# Patient Record
Sex: Female | Born: 1945 | Race: White | Hispanic: No | Marital: Married | Smoking: Never smoker
Health system: Southern US, Community
[De-identification: ages and names within clinical notes are randomized; demographics above are authoritative.]

## PROBLEM LIST (undated history)

## (undated) DIAGNOSIS — E876 Hypokalemia: Secondary | ICD-10-CM

## (undated) DIAGNOSIS — I509 Heart failure, unspecified: Secondary | ICD-10-CM

## (undated) DIAGNOSIS — Z7901 Long term (current) use of anticoagulants: Secondary | ICD-10-CM

## (undated) DIAGNOSIS — I639 Cerebral infarction, unspecified: Secondary | ICD-10-CM

## (undated) DIAGNOSIS — I480 Paroxysmal atrial fibrillation: Secondary | ICD-10-CM

## (undated) HISTORY — DX: Hypokalemia: E87.6

## (undated) HISTORY — DX: Cerebral infarction, unspecified: I63.9

## (undated) HISTORY — DX: Heart failure, unspecified: I50.9

## (undated) HISTORY — DX: Paroxysmal atrial fibrillation: I48.0

## (undated) HISTORY — DX: Long term (current) use of anticoagulants: Z79.01

## (undated) HISTORY — PX: TONSILLECTOMY AND ADENOIDECTOMY: SHX28

## (undated) HISTORY — PX: CHOLECYSTECTOMY: SHX55

---

## 1999-11-03 ENCOUNTER — Other Ambulatory Visit: Admission: RE | Admit: 1999-11-03 | Discharge: 1999-11-03 | Payer: Self-pay | Admitting: Obstetrics & Gynecology

## 2000-05-07 ENCOUNTER — Encounter: Payer: Self-pay | Admitting: Emergency Medicine

## 2000-05-07 ENCOUNTER — Inpatient Hospital Stay (HOSPITAL_COMMUNITY): Admission: AD | Admit: 2000-05-07 | Discharge: 2000-05-11 | Payer: Self-pay | Admitting: Cardiology

## 2000-10-13 ENCOUNTER — Encounter: Payer: Self-pay | Admitting: Emergency Medicine

## 2000-10-13 ENCOUNTER — Emergency Department (HOSPITAL_COMMUNITY): Admission: EM | Admit: 2000-10-13 | Discharge: 2000-10-13 | Payer: Self-pay | Admitting: Emergency Medicine

## 2000-11-25 ENCOUNTER — Ambulatory Visit (HOSPITAL_COMMUNITY): Admission: RE | Admit: 2000-11-25 | Discharge: 2000-11-25 | Payer: Self-pay | Admitting: Cardiology

## 2004-03-31 ENCOUNTER — Ambulatory Visit: Payer: Self-pay | Admitting: *Deleted

## 2004-05-05 ENCOUNTER — Ambulatory Visit: Payer: Self-pay

## 2004-06-04 ENCOUNTER — Ambulatory Visit: Payer: Self-pay | Admitting: *Deleted

## 2004-07-01 ENCOUNTER — Ambulatory Visit: Payer: Self-pay | Admitting: Cardiology

## 2004-07-08 ENCOUNTER — Ambulatory Visit: Payer: Self-pay

## 2004-08-04 ENCOUNTER — Ambulatory Visit: Payer: Self-pay | Admitting: Cardiology

## 2004-12-29 ENCOUNTER — Ambulatory Visit: Payer: Self-pay | Admitting: Cardiology

## 2005-04-16 ENCOUNTER — Other Ambulatory Visit: Admission: RE | Admit: 2005-04-16 | Discharge: 2005-04-16 | Payer: Self-pay | Admitting: Family Medicine

## 2005-07-29 ENCOUNTER — Ambulatory Visit: Payer: Self-pay | Admitting: Cardiology

## 2005-12-28 ENCOUNTER — Ambulatory Visit: Payer: Self-pay | Admitting: Cardiology

## 2006-09-30 ENCOUNTER — Ambulatory Visit: Payer: Self-pay | Admitting: Cardiology

## 2006-10-07 ENCOUNTER — Ambulatory Visit: Payer: Self-pay | Admitting: Cardiology

## 2006-10-07 ENCOUNTER — Ambulatory Visit (HOSPITAL_COMMUNITY): Admission: RE | Admit: 2006-10-07 | Discharge: 2006-10-07 | Payer: Self-pay | Admitting: Cardiology

## 2007-09-15 ENCOUNTER — Encounter (INDEPENDENT_AMBULATORY_CARE_PROVIDER_SITE_OTHER): Payer: Self-pay | Admitting: *Deleted

## 2007-09-15 ENCOUNTER — Ambulatory Visit: Payer: Self-pay | Admitting: Cardiology

## 2007-09-15 LAB — CONVERTED CEMR LAB
BUN: 12 mg/dL
Chloride: 104 meq/L
Sodium: 142 meq/L
Total Protein: 7.7 g/dL

## 2007-09-30 ENCOUNTER — Ambulatory Visit: Payer: Self-pay | Admitting: Cardiology

## 2008-10-05 DIAGNOSIS — I509 Heart failure, unspecified: Secondary | ICD-10-CM | POA: Insufficient documentation

## 2008-10-05 DIAGNOSIS — I6350 Cerebral infarction due to unspecified occlusion or stenosis of unspecified cerebral artery: Secondary | ICD-10-CM | POA: Insufficient documentation

## 2008-10-05 DIAGNOSIS — I4891 Unspecified atrial fibrillation: Secondary | ICD-10-CM | POA: Insufficient documentation

## 2008-10-08 ENCOUNTER — Encounter: Payer: Self-pay | Admitting: Cardiology

## 2008-10-08 ENCOUNTER — Ambulatory Visit: Payer: Self-pay | Admitting: Cardiology

## 2009-01-07 ENCOUNTER — Encounter (INDEPENDENT_AMBULATORY_CARE_PROVIDER_SITE_OTHER): Payer: Self-pay | Admitting: *Deleted

## 2009-01-07 LAB — CONVERTED CEMR LAB: OCCULT 1: NEGATIVE

## 2009-04-08 ENCOUNTER — Telehealth: Payer: Self-pay | Admitting: Cardiology

## 2009-04-09 ENCOUNTER — Encounter (INDEPENDENT_AMBULATORY_CARE_PROVIDER_SITE_OTHER): Payer: Self-pay | Admitting: *Deleted

## 2009-06-11 ENCOUNTER — Telehealth (INDEPENDENT_AMBULATORY_CARE_PROVIDER_SITE_OTHER): Payer: Self-pay | Admitting: *Deleted

## 2009-06-20 ENCOUNTER — Emergency Department (HOSPITAL_COMMUNITY): Admission: EM | Admit: 2009-06-20 | Discharge: 2009-06-21 | Payer: Self-pay | Admitting: Emergency Medicine

## 2009-06-21 ENCOUNTER — Ambulatory Visit: Payer: Self-pay | Admitting: Cardiology

## 2009-06-26 ENCOUNTER — Encounter (INDEPENDENT_AMBULATORY_CARE_PROVIDER_SITE_OTHER): Payer: Self-pay | Admitting: *Deleted

## 2009-06-26 ENCOUNTER — Ambulatory Visit: Payer: Self-pay | Admitting: Cardiology

## 2009-06-26 DIAGNOSIS — R42 Dizziness and giddiness: Secondary | ICD-10-CM | POA: Insufficient documentation

## 2009-06-27 ENCOUNTER — Encounter: Payer: Self-pay | Admitting: Cardiology

## 2009-12-16 ENCOUNTER — Encounter (INDEPENDENT_AMBULATORY_CARE_PROVIDER_SITE_OTHER): Payer: Self-pay | Admitting: *Deleted

## 2009-12-16 ENCOUNTER — Ambulatory Visit: Payer: Self-pay | Admitting: Cardiology

## 2009-12-16 DIAGNOSIS — R0989 Other specified symptoms and signs involving the circulatory and respiratory systems: Secondary | ICD-10-CM

## 2009-12-16 LAB — CONVERTED CEMR LAB
Basophils Absolute: 0 10*3/uL
Basophils Relative: 0 %
Eosinophils Absolute: 0.1 10*3/uL (ref 0.0–0.7)
Eosinophils Relative: 2 %
HCT: 42.4 % (ref 36.0–46.0)
Hemoglobin: 14.8 g/dL
Lymphocytes Relative: 25 %
Lymphs Abs: 1.8 10*3/uL
MCHC: 33.3 g/dL
MCHC: 34.9 g/dL (ref 30.0–36.0)
MCV: 95.5 fL
Monocytes Absolute: 0.5 10*3/uL (ref 0.1–1.0)
Monocytes Relative: 7 %
Neutrophils Relative %: 212 %
Platelets: 14.1 10*3/uL
Platelets: 212 10*3/uL (ref 150–400)
RBC: 4.44 M/uL
RDW: 14.1 % (ref 11.5–15.5)
RDW: 34.9 %
WBC: 7.3 10*3/uL

## 2009-12-17 ENCOUNTER — Encounter: Payer: Self-pay | Admitting: Cardiology

## 2009-12-19 ENCOUNTER — Ambulatory Visit: Payer: Self-pay | Admitting: Cardiology

## 2009-12-19 ENCOUNTER — Ambulatory Visit (HOSPITAL_COMMUNITY): Admission: RE | Admit: 2009-12-19 | Discharge: 2009-12-19 | Payer: Self-pay | Admitting: Cardiology

## 2009-12-23 ENCOUNTER — Encounter (INDEPENDENT_AMBULATORY_CARE_PROVIDER_SITE_OTHER): Payer: Self-pay | Admitting: *Deleted

## 2009-12-24 ENCOUNTER — Telehealth (INDEPENDENT_AMBULATORY_CARE_PROVIDER_SITE_OTHER): Payer: Self-pay | Admitting: *Deleted

## 2009-12-25 ENCOUNTER — Encounter (INDEPENDENT_AMBULATORY_CARE_PROVIDER_SITE_OTHER): Payer: Self-pay | Admitting: *Deleted

## 2010-04-25 ENCOUNTER — Encounter (INDEPENDENT_AMBULATORY_CARE_PROVIDER_SITE_OTHER): Payer: Self-pay | Admitting: *Deleted

## 2010-04-25 LAB — CONVERTED CEMR LAB
OCCULT 2: NEGATIVE
OCCULT 3: NEGATIVE

## 2010-04-28 ENCOUNTER — Encounter (INDEPENDENT_AMBULATORY_CARE_PROVIDER_SITE_OTHER): Payer: Self-pay | Admitting: *Deleted

## 2010-06-17 NOTE — Assessment & Plan Note (Signed)
Summary: W1X      Allergies Added: NKDA  Visit Type:  Follow-up Primary Provider:  Dr. Meyers([facility]   History of Present Illness: [name] returns to the office as scheduled for continued assessment and treatment of paroxysmal atrial fibrillation.  Since her last visit, she has done superbly.  She reports no palpitations, no dyspnea no chest discomfort.  She intermittently experiences very mild edema that resolves with leg elevation.  She reports stress related to assisting with the management of her husband's medical problems.  Current Medications (verified): 1)  Verapamil Hcl Cr 360 Mg Xr24h-Cap (Verapamil Hcl) .... Take One Capsule By Mouth Daily 2)  Warfarin Sodium 1 Mg Tabs (Warfarin Sodium) .... Use As Directed By Anticoagualtion Clinic 3)  Warfarin Sodium 5 Mg Tabs (Warfarin Sodium) .... Use As Directed By Anticoagulation Clinic 4)  Tambocor 150 Mg Tabs (Flecainide Acetate) .... Take 1 Tablet By Mouth Two Times A Day  Allergies (verified): No Known Drug Allergies  Past History:  PMH, FH, and Social History reviewed and updated.  Past Medical History: PAROXYSMAL ATRIAL FIBRILLATION with difficult to control rate-presented in 2001 with CHF; DCCV-2002;      Symptomatic bradycardia with low-dose beta blocker CHF (ICD-428.0); negative stress nuclear study in 06/2004; borderline Ejection fraction Right parietal embolic CVA in 04/2000 Chronic anticoagulation-dose adjusted by PMD Hypokalemia  Review of Systems       See history of present illness.  Vital Signs:  Patient profile:   65 year old female Weight:      167 pounds BMI:     27.89 Pulse rate:   67 / minute BP sitting:   143 / 73  (right arm)  Vitals Entered By: Dreama Saa, CNA (December 16, 2009 2:33 PM)  Physical Exam  General:  Mildly overweight; well developed; no acute distress:   Neck-No JVD; left carotid bruits: Lungs-No tachypnea; no rhonchi; no wheezes; a few basilar rales that  clear with cough Cardiovascular-normal PMI; distant S1 and S2; grade 1-2/6 systolic ejection murmur Abdomen-BS normal; soft and non-tender without masses or organomegaly:  Musculoskeletal-No deformities, no cyanosis or clubbing: Neurologic-Normal cranial nerves; symmetric strength and tone:  Skin-Warm, no significant lesions: Extremities-Nl distal pulses; trace edema:     Impression & Recommendations:  Problem # 1:  PAROXYSMAL ATRIAL FIBRILLATION (ICD-427.31) Patient originally failed to increase her dose of flecainide to 150 mg b.i.d. when ordered; now she has increased her dose after I thought that we were going to continue 100 mg b.i.d.  In any case, the increased dose appears to be working well.  A serum level of flecainide will be obtained.  Problem # 2:  CVA (ICD-434.91) Prior neurologic event was almost certainly cardioembolic in origin.  Now, with with a carotid bruit, she does appear to have cerebrovascular disease.  This will be evaluated with carotid ultrasound.  If results of her testing are acceptable, I will plan to see this nice woman again in one year.  She will call sooner should she develop palpitations or other cardiac symptoms.  Problem # 3:  Edema Instructions provided regarding leg elevation, salt restriction and possible use of compression stockings.  Patient prefers to institute the first two out of three for the time being.  Other Orders: Hemoccult Cards (Take Home) (Hemoccult Cards) T-CBC w/Diff [phone]) T- * Misc. Laboratory test [phone]) Carotid Duplex (Carotid Duplex)  Patient Instructions: 1)  Your physician recommends that you schedule a follow-up appointment in: 1 year 2)  Your physician recommends  that you return for lab work in: today 3)  Your physician has requested that you limit the intake of sodium (salt) in your diet to four grams daily. Please see [facility] handout. 4)  Your physician has asked that you test your stool for blood. It is  necessary to test 3 different stool specimens for accuracy. You will be given 3 hemoccult cards for specimen collection. For each stool specimen, place a small portion of stool sample (from 2 different areas of the stool) into the 2 squares on the card. Close card. Repeat with 2 more stool specimens. Bring the cards back to the office for testing.

## 2010-06-17 NOTE — Assessment & Plan Note (Signed)
Summary: post ED visit per DR.Tramane Gorum/tg  Medications Added TAMBOCOR 150 MG TABS (FLECAINIDE ACETATE) Take 1 tablet by mouth two times a day      Allergies Added: NKDA  Visit Type:  Follow-up Primary Provider:  Dr. Meyers([facility]   History of Present Illness: Return visit for this very pleasant 65 year old woman recently seen in the emergency department at [facility] with recurrent atrial fibrillation.  Heart rate was reportedly 130 at presentation.  She noted 1-2 days of increasing fatigue and malaise.  She experienced some chest tightness.  She noted palpitations just prior to proceeding to the Emergency Room.  There was no lightheadedness and certainly no syncope or fall.  Her INR was found to be 1.9 prompting administration of Lovenox.  She was also apparently given an additional dose of an AV nodal blocking agent intravenously, but was discharged from the Emergency Department with no change in her home medication regime.  Over the past week, she has remained symptomatic, but less so.  She has felt tired and has substantially decreased activity.  There is been no peripheral edema, orthopnea, exertional dyspnea or PND.  Her electronic sphygmomanometer at home has shown good blood pressure readings, but bradycardic heart rates in the 40s.  There was an interval of one day over the past week when she felt better and her blood pressure device appeared to be detecting a regular rhythm.  Her symptoms resolved completely yesterday.  We attempted to change flecainide from 100 mg tablets to 150 mg tablets at her last visit, but this was never achieved.  A flecainide level was 0.77 in May, high therapeutic.  Ms. Stoneberg also notes mild orthostatic lightheadedness, typically when arising in the morning.  She manages this well by sitting for a while and then standing for a while before starting her daily activity.  Current Medications (verified): 1)  Verapamil Hcl Cr 360 Mg  Xr24h-Cap (Verapamil Hcl) .... Take One Capsule By Mouth Daily 2)  Warfarin Sodium 1 Mg Tabs (Warfarin Sodium) .... Use As Directed By Anticoagualtion Clinic 3)  Warfarin Sodium 5 Mg Tabs (Warfarin Sodium) .... Use As Directed By Anticoagulation Clinic 4)  Tambocor 150 Mg Tabs (Flecainide Acetate) .... Take 1 Tablet By Mouth Two Times A Day  Allergies (verified): No Known Drug Allergies  Past History:  PMH, FH, and Social History reviewed and updated.  Review of Systems       see history of present illness.  Vital Signs:  Patient profile:   65 year old female Weight:      168 pounds Pulse rate:   68 / minute Pulse (ortho):   72 / minute BP sitting:   127 / 77  (right arm) BP standing:   118 / 80  Vitals Entered By: Dreama Saa, CNA (June 26, 2009 2:57 PM)  Serial Vital Signs/Assessments:  Time      Position  BP       Pulse  Resp  Temp     By 3:42 PM   Lying RA  133/83   68                    Tammy Sanders RN 3:42 PM   Sitting   125/80   64                    Teressa Lower RN 3:42 PM   Standing  118/80   72  Teressa Lower RN  Comments: 3:42 PM no symptoms-lying no symptoms-sitting no symptoms-standing By: Teressa Lower RN    Physical Exam  General:   General-Well developed; no acute distress:   Neck-No JVD; no carotid bruits: Lungs-No tachypnea, no rales; no rhonchi; no wheezes: Cardiovascular-normal PMI; distant S1 and S2: Abdomen-BS normal; soft and non-tender without masses or organomegaly:  Musculoskeletal-No deformities, no cyanosis or clubbing: Neurologic-Normal cranial nerves; symmetric strength and tone:  Skin-Warm, no significant lesions: Extremities-Nl distal pulses;1/2+ edema:     EKG  Procedure date:  06/26/2009  Findings:      Rhythm Strip  Normal sinus rhythm at a rate of 64 bpm Borderline first degree AV block No change when compared to a previous tracing of 10/08/2008   Impression & Recommendations:  Problem  # 1:  PAROXYSMAL ATRIAL FIBRILLATION (ICD-427.31) Assessment New This is Ms. Pankowski's first prolonged recurrence since Tambocor was started nearly a decade ago.  I am not inclined to give up on a drug that has worked so well just yet.  She will continue her current medical regime and call for any recurrent symptoms.  If she has atrial fibrillation with a rapid ventricular response, we will add digoxin.  She has previously developed extreme bradycardia with a low dose of metoprolol; accordingly, beta blockers will probably not be useful although a sympathomimetic agent could be considered.  A routine return visit will be scheduled in 6 months.  Problem # 2:  CHF (ICD-428.0) Congestive heart failure originally developed with her initial prolonged episode of atrial fibrillation and with no rate control agents in her system.  It appears that she did better with her most recent arrhythmic spell.  Problem # 3:  CVA (ICD-434.91) Her risk of thromboembolism is of concern, since she has suffered a CVA in the past.  On the other hand, she appears to have very little atrial fibrillation, which would suggest a low risk for a second event.  Her INR has generally been well controlled, and an occasional dip to 1.9 is of minimal concern.  Her PMD will continue to manage this aspect of her care.  Problem # 4:  POSTURAL LIGHTHEADEDNESS (ICD-780.4) Ms. Tison has noted this symptom for some time.  Orthostatic vital signs show a marginal posterolateral change with a decrease of 15 mmHg in systolic blood pressure and an increase of 13 bpm and heart rate.Early morning values could certainly be more impressive.  It is reasonable to believe that her postural symptoms reflect a drop in blood pressure.  She already is managing this with behavioral techniques.  She was advised that extra salt intake can also be utilized.  Other Orders: Hemoccult Cards (Take Home) (Hemoccult Cards)  Patient Instructions: 1)  Your physician  recommends that you schedule a follow-up appointment in: 6 months 2)  Your physician has recommended you make the following change in your medication:  increase flecainide to 150mg  two times a day 3)  Your physician has asked that you test your stool for blood. It is necessary to test 3 different stool specimens for accuracy. You will be given 3 hemoccult cards for specimen collection. For each stool specimen, place a small portion of stool sample (from 2 different areas of the stool) into the 2 squares on the card. Close card. Repeat with 2 more stool specimens. Bring the cards back to the office for testing. Prescriptions: TAMBOCOR 150 MG TABS (FLECAINIDE ACETATE) Take 1 tablet by mouth two times a day  #60 x 6  Entered by:   Teressa Lower RN   Authorized by:   Kathlen Brunswick, MD, [facility]   Signed by:   Teressa Lower RN on 06/26/2009   Method used:   [phone]       [address] [place], [place]  [address]       Ph: [phone] or [phone]       Fax: [phone]   RxID:   [id number]

## 2010-06-17 NOTE — Miscellaneous (Signed)
Summary: LABS CMP,09/15/2007  Clinical Lists Changes  Observations: Added new observation of CALCIUM: 9.5 mg/dL (44/07/4740 5:95) Added new observation of ALBUMIN: 4.7 g/dL (63/87/5643 3:29) Added new observation of PROTEIN, TOT: 7.7 g/dL (51/88/4166 0:63) Added new observation of SGPT (ALT): 11 units/L (09/15/2007 8:55) Added new observation of SGOT (AST): 14 units/L (09/15/2007 8:55) Added new observation of ALK PHOS: 81 units/L (09/15/2007 8:55) Added new observation of CREATININE: 0.78 mg/dL (01/60/1093 2:35) Added new observation of BUN: 12 mg/dL (57/32/2025 4:27) Added new observation of BG RANDOM: 90 mg/dL (11/08/7626 3:15) Added new observation of CO2 PLSM/SER: 22 meq/L (09/15/2007 8:55) Added new observation of CL SERUM: 104 meq/L (09/15/2007 8:55) Added new observation of K SERUM: 4.8 meq/L (09/15/2007 8:55) Added new observation of NA: 142 meq/L (09/15/2007 8:55)

## 2010-06-17 NOTE — Miscellaneous (Signed)
Summary: labs cbcd,flecanide 12/16/2009 flecanide 0.69  Clinical Lists Changes  Observations: Added new observation of ABSOLUTE BAS: 0.0 K/uL (12/16/2009 8:17) Added new observation of BASOPHIL %: 0 % (12/16/2009 8:17) Added new observation of EOS ABSLT: 0.1 K/uL (12/16/2009 8:17) Added new observation of % EOS AUTO: 2 % (12/16/2009 8:17) Added new observation of ABSOLUTE MON: 0.5 K/uL (12/16/2009 8:17) Added new observation of MONOCYTE %: 7 % (12/16/2009 8:17) Added new observation of ABS LYMPHOCY: 1.8 K/uL (12/16/2009 8:17) Added new observation of LYMPHS %: 25 % (12/16/2009 8:17) Added new observation of PMN %: 212 % (12/16/2009 8:17) Added new observation of PLATELETK/UL: 14.1 K/uL (12/16/2009 8:17) Added new observation of RDW: 34.9 % (12/16/2009 8:17) Added new observation of [facility] RBC: 33.3 g/dL (16/02/9603 5:40) Added new observation of MCV: 95.5 fL (12/16/2009 8:17) Added new observation of HCT: 42.4 % (12/16/2009 8:17) Added new observation of HGB: 14.8 g/dL (98/03/9146 8:29) Added new observation of RBC M/UL: 4.44 M/uL (12/16/2009 8:17) Added new observation of WBC COUNT: 7.3 10*3/microliter (12/16/2009 8:17)

## 2010-06-17 NOTE — Progress Notes (Signed)
Summary: rxvrefill  Medications Added FLECAINIDE ACETATE 100 MG TABS (FLECAINIDE ACETATE) Take one and 1/2 tabs once daily       Phone Note Call from Patient Call back at Home Phone [phone]   Caller: pt Reason for Call: Refill Medication Summary of Call: pt needs new rx for verapamil  [facility] [place] [phone]. Initial call taken by: Faythe Ghee,  June 11, 2009 9:02 AM    New/Updated Medications: FLECAINIDE ACETATE 100 MG TABS (FLECAINIDE ACETATE) Take one and 1/2 tabs once daily Prescriptions: FLECAINIDE ACETATE 100 MG TABS (FLECAINIDE ACETATE) Take one and 1/2 tabs once daily  #45 x 3   Entered by:   Larita Fife Via LPN   Authorized by:   Kathlen Brunswick, MD, [facility]   Signed by:   Larita Fife Via LPN on 47/82/9562   Method used:   [facility] to        [facility] [phone][facility] (retail)       [address] [place], [place]  [address]       Ph: [phone] or [phone]       Fax: [phone]   RxID:   [id number]

## 2010-06-17 NOTE — Progress Notes (Signed)
Summary: CAROTID RESULTS   Phone Note Call from Patient Call back at Home Phone [phone]   Caller: Patient Reason for Call: Talk to Nurse, Lab or Test Results Summary of Call: PT CALLING TO GET HER CAROTID RESULTS Initial call taken by: Edman Circle,  December 24, 2009 3:39 PM  Follow-up for Phone Call        [facility], mailed results letter to pt. Follow-up by: Teressa Lower RN,  December 25, 2009 3:39 PM

## 2010-06-17 NOTE — Letter (Signed)
Summary: [place] Results Engineer, agricultural at [facility]  [address] [place] [address]   Phone: [phone]  Fax: [phone]      December 25, 2009 MRN: [id number]   [facility] [address] [place], [place]  [address]   Dear Ms. Clarida,  Your test ordered by Selena Batten has been reviewed by your physician (or physician assistant) and was found to be normal or stable. Your physician (or physician assistant) felt no changes were needed at this time.  ____ Echocardiogram  ____ Cardiac Stress Test  ____ Lab Work  __x__ Peripheral vascular study of arms, legs or neck  ____ CT scan or X-ray  ____ Lung or Breathing test  ____ Other:  No evidence of significant carotid stenosis or disease, per Dr. Dietrich Pates.  Thank you, Tammy Allyne Gee RN    [place] Bing, MD, [facility].C.Gaylord Shih, MD, F.A.C.C Lewayne Bunting, MD, F.A.C.C Nona Dell, MD, F.A.C.C Charlton Haws, MD, [facility].C.C

## 2010-06-17 NOTE — Assessment & Plan Note (Signed)
**Note De-Identified Vince Ainsley Obfuscation** Summary: NURSE VISIT PER TAMMY/DR.ROTHBART/TG  Nurse Visit   Vital Signs:  Patient profile:   65 year old female Pulse rate:   70 / minute BP sitting:   132 / 77  (left arm)  Impression & Recommendations:  Problem # 1:  PAROXYSMAL ATRIAL FIBRILLATION (ICD-427.31) Patient has converted back to normal sinus rhythm since emergency department visit yesterday.  She is taking a maximal dose of flecainide and has had a therapeutic level documented in the past.  A rare episode of self-limited paroxysmal atrial fibrillation may not mandate a change in therapy.  I will reassess this patient next week in an office visit.  [place] Bing, M.D. Patient advised that she has appt. with Dr. Dietrich Pates on Feb. 9, 2011 @ 3:00. Larita Fife Lorece Keach LPN  June 24, 2009 11:07 AM   History of Present Illness: See rhythm strip scanned into chart. Patient has no complaints at this time.   Current Medications (verified): 1)  Verapamil Hcl Cr 360 Mg Xr24h-Cap (Verapamil Hcl) .... Take One Capsule By Mouth Daily 2)  Warfarin Sodium 1 Mg Tabs (Warfarin Sodium) .... Use As Directed By Anticoagualtion Clinic 3)  Warfarin Sodium 5 Mg Tabs (Warfarin Sodium) .... Use As Directed By Anticoagulation Clinic 4)  Caltrate 600+d Plus 600-400 Mg-Unit Tabs (Calcium Carbonate-Vit D-Min) .... As Needed 5)  Flecainide Acetate 100 Mg Tabs (Flecainide Acetate) .... Take One and 1/2 Tabs Once Daily  Allergies (verified): No Known Drug Allergies

## 2010-06-19 NOTE — Miscellaneous (Signed)
Summary: hemoccult cards   Clinical Lists Changes  Observations: Added new observation of HEMOCCULT 3: neg (04/25/2010 14:36) Added new observation of HEMOCCULT 2: neg (04/25/2010 14:36) Added new observation of HEMOCCULT 1: neg (04/25/2010 14:36)

## 2010-07-29 ENCOUNTER — Encounter: Payer: Self-pay | Admitting: *Deleted

## 2010-08-05 NOTE — Letter (Signed)
Summary: [place] Results Engineer, agricultural at [facility]  [address] [place] [address]   Phone: [phone]  Fax: [phone]      July 29, 2010 MRN: [id number]   [facility] [address] [place], [place]  [address]   Dear Ms. Shehadeh,  Your test ordered by Selena Batten has been reviewed by your physician (or physician assistant) and was found to be normal or stable. Your physician (or physician assistant) felt no changes were needed at this time.  ____ Echocardiogram  ____ Cardiac Stress Test  ____ Lab Work  ____ Peripheral vascular study of arms, legs or neck  ____ CT scan or X-ray  ____ Lung or Breathing test  __x__ Other: stool cards  No blood was detected in any of your stool samples.  Please continue your current medications. Thank you, Doyel Mulkern Allyne Gee RN    [place] Bing, MD, [facility].C.Gaylord Shih, MD, F.A.C.C Lewayne Bunting, MD, F.A.C.C Nona Dell, MD, F.A.C.C Charlton Haws, MD, [facility].C.C

## 2010-08-06 LAB — CBC
HCT: 42.5 % (ref 36.0–46.0)
MCHC: 35 g/dL (ref 30.0–36.0)
RBC: 4.32 MIL/uL (ref 3.87–5.11)
RDW: 13.8 % (ref 11.5–15.5)

## 2010-08-06 LAB — BASIC METABOLIC PANEL
BUN: 13 mg/dL (ref 6–23)
Calcium: 9.1 mg/dL (ref 8.4–10.5)
GFR calc Af Amer: >60 mL/min (ref >60)

## 2010-08-06 LAB — DIFFERENTIAL
Basophils Relative: 1 % (ref 0–1)
Eosinophils Absolute: 0.1 10*3/uL (ref 0.0–0.7)
Monocytes Absolute: 0.5 10*3/uL (ref 0.1–1.0)
Monocytes Relative: 8 % (ref 3–12)

## 2010-08-06 LAB — POCT CARDIAC MARKERS: Troponin i, poc: <0.05 ng/mL (ref 0.00–0.09)

## 2010-09-30 NOTE — Procedures (Signed)
NAMENANIE, DUNKLEBERGER             ACCOUNT NO.:  [id number]   MEDICAL RECORD NO.:  [id number]          PATIENT TYPE:  OUT   LOCATION:  RAD                           FACILITY:  [facility]   PHYSICIAN:  Gerrit Friends. Dietrich Pates, MD, FACCDATE OF BIRTH:  04/23/1946   DATE OF PROCEDURE:  10/07/2006  DATE OF DISCHARGE:                                ECHOCARDIOGRAM   CLINICAL DATA:  A 65 year old woman with atrial fibrillation.   M-MODE:  Aorta 2.9, left atrium 3.6, septum 0.8, posterior wall 0.8, LV  diastole 4.4, LV systole 2.7.   1. Technically suboptimal but adequate echocardiographic study.  2. Left atrial size at the upper limit of normal; normal right atrium      and right ventricle.  3. Normal aortic valve; normal proximal ascending aorta.  4. Normal mitral valve with trivial regurgitation.  5. Normal tricuspid valve.  6. Pulmonic valve and proximal pulmonary artery not well seen, but are      grossly normal.  7. Normal left ventricular size; no LVH.  Regional and global LV      systolic function are at the lower limits of normal.  Estimated      ejection fraction is 0.55.  8. Normal IVC.      Gerrit Friends. Dietrich Pates, MD, [facility]  Electronically Signed     RMR/MEDQ  D:  10/08/2006  T:  10/08/2006  Job:  [id number]

## 2010-09-30 NOTE — Letter (Signed)
Sep 30, 2006    Royetta Crochet, MD  [address]  [place], [place] [address]   RE:  BONNELL, PLACZEK  MRN:  [id number]  /  DOB:  Nov 12, 1945   Dear Dr. Stacie Acres:   Mrs. Vonita Moss returned to the office as scheduled for continuing  assessment and treatment of paroxysmal atrial fibrillation.  She has  done extremely well since her last visit.  She has had a few brief  episodes of palpitations.  She has had no dyspnea nor chest discomfort.  She notes no bowel dysfunction related to her high dose of verapamil.  Warfarin has been managed from your office with stable dosing, but some  what variable INRs.   She has had no other medical issues.  Blood pressure has been good.   CURRENT MEDICATIONS:  1. Verapamil 360 mg b.i.d.  2. Warfarin as directed.  3. Calcium supplement with vitamin D b.i.d.  4. Flecainide 150 mg b.i.d.   PHYSICAL EXAMINATION:  GENERAL:  Pleasant, trim woman in no acute  distress.  VITAL SIGNS:  Weight 148, one pound less than in August, but 30 pounds  less than in August of 2006, blood pressure 115/70, heart rate 66 and  regular, respirations 16.  NECK:  No JVD.  Normal carotid upstrokes without bruits.  LUNGS:  Clear.  CARDIAC:  Normal first and second heart sounds.  Fourth heart sound a  minimal systolic ejection murmur.  ABDOMEN:  Soft and nontender; no organomegaly.  EXTREMITIES:  Trace edema.  Normal distal pulses.   IMPRESSION:  Mrs. Duplessis is doing beautifully.  Her most recent echo  was performed back in 2001 and suggested a degree of left ventricular  dysfunction.  We will repeat that to determine whether she in fact has  any structural heart disease.  We will leave Warfarin dosing to your  discretion.  I have asked her to reduce Verapamil to 360 mg daily.  She  does not appear to be having any significant periods of atrial  fibrillation, and thus, does not need high does medication for rate  control.  I will plan to see this nice woman again in 9  months.  Please  call me at anytime that I can assist with her care.    Sincerely,      Gerrit Friends. Dietrich Pates, MD, [facility]    RMR/MedQ  DD: 09/30/2006  DT: 09/30/2006  Job #: [id number]

## 2010-09-30 NOTE — Letter (Signed)
Oct 08, 2008    Tamara Rua, MD  [facility] at [facility]  [address], [place] [address]   RE:  Tamara, Hudson  MRN:  [id number]  /  DOB:  December 09, 1945   Dear Dr. Lenise Hudson:   Tamara Hudson returns to the office for continued assessment and  treatment of paroxysmal atrial fibrillation with a history of embolic  CVA and congestive heart failure.  Since initial therapy, she has done  superbly.  She experiences rare episodes of palpitations lasting a  matter of minutes.  She estimates that this may have happened 3 times in  the past year.  There are no associated symptoms.  She is active  including assisting with the care of her disabled husband, who has  severe chronic back problems.  She reports no dyspnea nor chest  discomfort.  She has had no new medical issues since I last saw her 12  months ago.   Recent laboratory studies are not available to me.  One year ago, she  had a normal CBC and normal chemistry profile.  Flecainide level was  slightly elevated at 1.1.   Current medications include:  1. Flecainide 150 mg b.i.d.  2. Warfarin as directed.   She reports somewhat variable INRs of late, but they have been slightly  on the high side, which provides her adequate protection from a repeat  embolism.  She also takes verapamil 360 mg daily.   PHYSICAL EXAMINATION:  GENERAL:  Trim, pleasant woman in no acute  distress.  VITAL SIGNS:  The weight is 161, 10 pounds more than last year.  Blood  pressure 130/70, heart rate 60 and regular, respirations 12 and  unlabored.  NECK:  No jugular venous distention; normal carotid upstrokes without  bruits.  LUNGS:  Clear.  CARDIAC:  Normal first and second heart sounds; minimal systolic murmur.  ABDOMEN:  Soft and nontender; no organomegaly.  EXTREMITIES:  No edema; normal distal pulses.  PSYCHIATRIC:  Alert and oriented; normal affect.  NEUROLOGIC:  Symmetric strength and tone; normal cranial nerves.   EKG:  Sinus bradycardia rate of 58; borderline first-degree AV block;  borderline low voltage in the chest leads; nonspecific T-wave  abnormality; no QT prolongation.  Comparison to a prior tracing of September 15, 2007:  No significant interval change.   IMPRESSION:  Tamara Hudson is doing superbly on flecainide.  Since she  experiences possible brief occurrences of atrial fibrillation, she  continues to require rate control medication.  Her flecainide level was  on the high side a year ago and will be repeated.  She currently has 100  mg tablets, which did not break evenly such that her daily dose various.  We will try to replace these with 150 mg tablets.  Although we are not  involved in adjusting her dosage of warfarin, stool for hemoccult  testing and a CBC will be obtained in light of her continuing chronic  anticoagulation.  I will see this nice woman again in 11 months.    Sincerely,      Tamara Friends. Dietrich Pates, MD, [facility]  Electronically Signed    RMR/MedQ  DD: 10/08/2008  DT: 10/09/2008  Job #: [id number]

## 2010-09-30 NOTE — Letter (Signed)
September 15, 2007    Joycelyn Rua, M.D.  [address]  [place], [place]  [address]   RE:  JAYDI, BRAY  MRN:  [id number]  /  DOB:  01-18-1946   Dear Dr. Lenise Arena,   Ms. Badalamenti returns to the office for continued assessment and  treatment of paroxysmal atrial fibrillation.  She has been maintained on  flecainide with excellent control of her arrhythmia.  She notes  palpitations on rare occasions, perhaps once every one to two months,  lasting minutes to perhaps as long as 1/2 hour.  She has some weakness  in her arms with these spells and fatigue been no lightheadedness and  certainly no syncope.  Otherwise, she pursues all of her usual  activities without dyspnea or chest discomfort.  She very rarely notes  any ankle edema.   CURRENT MEDICATIONS:  1. Verapamil 360 mg daily.  2. Warfarin as managed from your office.  3. Caltrate plus vitamin D.  4. Flecainide 150 mg b.i.d.   PHYSICAL EXAMINATION:  GENERAL:  Pleasant but somewhat vague woman in no  acute distress.  The weight is 151, 3 pounds more than in May of last  year.  Blood pressure 115/70, heart rate 56 and regular, respirations  14.  NECK:  No jugular venous distention; no carotid bruits.  LUNGS:  Clear.  CARDIAC:  Normal first and second heart sounds; fourth heart sound  present.  ABDOMEN:  Soft and nontender; no organomegaly.  EXTREMITIES:  No edema.   IMPRESSION:  Ms. Pietro continues to do well with stable medical  regime.  It is not entirely clear that she requires verapamil, but one  of the episodes that she describes certainly could be  atrial fibrillation, and her previous spell was associated with a very  rapid ventricular response.  We will make no changes in her medical  therapy.  A chemistry profile, flecainide level, CBC and stool for  hemoccult testing will be obtained.  I will plan to reassess this nice  woman in 10 months.    Sincerely,      Gerrit Friends. Dietrich Pates, MD, [facility]  Electronically Signed    RMR/MedQ  DD: 09/15/2007  DT: 09/15/2007  Job #: [id number]

## 2010-10-03 NOTE — Letter (Signed)
December 28, 2005     Joycelyn Rua, M.D.  [address]  [place], [place]  [address]   RE:  Tamara Hudson, Tamara Hudson  MRN:  [id number]  /  DOB:  07-09-1945   Dear Dr. Lenise Arena,   Tamara Hudson returns to the office for continued assessment and treatment of  paroxysmal atrial fibrillation.  She experienced a CVA at the time of her  initial presentation and suffered from congestive heart failure as well  despite the fact that her ejection fraction was normal.  She has done  extremely well, being maintained on flecainide with no recent documented  recurrences of atrial fibrillation.  Blood pressure has been normal.  Bradycardia resolved after metoprolol was discontinued at her last visit.  She occasionally experiences brief palpitations as well as fatigue and  weakness in her chest.  She has felt much better as she has achieved  progressive weight loss in a [employer] program.   CURRENT MEDICATIONS:  Verapamil 360 mg q.d., warfarin as directed, Caltrate  plus vitamin D b.i.d. and flecainide 150 mg b.i.d.   RHYTHM STRIP:  Normal sinus rhythm;  borderline first-degree AV block.   PHYSICAL EXAMINATION:  GENERAL:  A pleasant woman in no acute distress.  VITAL SIGNS:  The weight is 149 pounds, 11 pounds less than 5 months ago and  56 pounds less than her highest weight in February of 2006.  Blood pressure  130/70, heart rate 68 and regular, respirations 16.  NECK:  No jugular venous distention;  no carotid bruits.  LUNGS:  Clear.  CARDIAC:  Normal first and second heart sounds;  Fourth heart sound and  modest systolic murmur present.  ABDOMEN:  Soft and nontender;  no bruits.  EXTREMITIES:  Distal pulses 1+;  no edema.   IMPRESSION:  Tamara Hudson is doing well with no clinical recurrence of  atrial fibrillation.  Weight loss may have made her less prone to this  arrhythmia.  We will consider tapering flecainide if she continues to  experience no significant palpitations.   Anticoagulation is being maintained  in your office.  I will plan to see this nice woman again in 8 months.  She  is reminded to receive influenza vaccine when available.    Sincerely,      Gerrit Friends. Dietrich Pates, MD, [facility]   RMR/MedQ  DD:  12/28/2005  DT:  12/29/2005  Job #:  [id number]

## 2010-10-03 NOTE — Discharge Summary (Signed)
[place]  Patient:    Tamara Hudson, Tamara Hudson                      MRN: [id number] Adm. Date:  [id number] Disc. Date: [id number] Attending:  Shelba Flake Dictator:   Joellyn Rued, P.A.-C. CC:         Dr. Arlyce Dice   Discharge Summary  DATE OF BIRTH:  July 17, 1945  HISTORY OF PRESENT ILLNESS:  Tamara Hudson is a 65 year old white female who presented with orthopnea since May 03, 2000.  She denied any chest discomfort, but on the morning of admission she was awakened at 5:45 a.m. with left arm weakness, slurred speech, and a left facial droop.  She was found to be in atrial fibrillation, thus her admission.  PAST MEDICAL HISTORY:  Hyperlipidemia.  LABORATORY DATA:  CPKs and troponins were negative for a myocardial infarction.  TSH was 2.06.  Sodium 137, potassium 3.3, BUN 9, creatinine 0.9, glucose 106.  H&H 12.5, 36.7, normal indices, platelets 305, wbcs 9.6.  On May 09, 2000, an INR was 1.3, on May 10, 2000, it was 1.8.  A CT scan at [facility] did not show any intracranial bleed or cortical infarction.  An MRI was performed and showed an acute sub-cm stroke in the high right parietal cortex.  There was minor small vessel-type changes elsewhere in the white matter of the cerebral hemispheres.  The internal carotids were patent. There were less branch vessels in the right middle cerebral seen, in comparison to the left.  There could be some branch occlusions.  An electrocardiogram showed atrial fibrillation with nonspecific ST-T wave changes, with a rapid ventricular rate.  An echocardiogram on May 10, 2000, showed normal LV function, although overall LV systolic function difficult to determine, due to rapid ventricular rate.  It was felt that there might be mildly depressed hypokinesis in the septal wall.  The size was normal.  Mild mitral annular calcification, mild to moderate left atrial enlargement,  mild right atrial enlargement.  There was small echo-free pericardial effusion along the right atrial free wall, with a small right pleural effusion.  HOSPITAL COURSE:  Tamara Hudson was admitted to 3700, and seen in consultation by Dr. Genene Churn. Love with neurology.  Carotid Dopplers were performed and did not show any evidence of significant internal carotid artery stenosis, and vertebral flow was antegrade.  She was continued on heparin and aspirin, since her CT was within normal limits.  She was placed on various medications to control her ventricular response; however, ventricular response continued to remain elevated, sometimes reaching the 180s.  With her increased medications, there were nursing notes throughout the chart that described 0.96 seconds, up to 2.6 second pauses, with stable vital signs, and the patient remained asymptomatic.  She was started on Coumadin on May 08, 2000.  She initially received 10 mg on May 08, 2000, and then was prescribed 5 mg q.d.  On May 10, 2000, her Coumadin was decreased to 2.5 mg q.d.  Her INR on May 11, 2000, was 2.0.  H&H was 12.0 and 38.0.  Sodium 143, potassium 3.4.  DISPOSITION:  By May 11, 2000, Dr. Gerrit Friends. Rothbart felt that she could be discharged home.  DISCHARGE DIAGNOSES: 1. Atrial fibrillation, with increased ventricular rate, very difficult    to control, with maximum medications. 2. Transient ischemic attack, followed by Dr. Sandria Manly. 3. Hypokalemia.  DISCHARGE MEDICATIONS: 1. New medications Cardizem CD  480 mg q.d.  She was given a prescription for    240 mg tablets, with instructions to take two tablets q.d. 2. Toprol XL 200 mg q.d. 3. Lasix 40 mg q.d. 4. Coumadin 5 mg 1/2 tablet q.d. between 4 and 6 p.m. 5. K-Dur 20 mEq q.d. 6. Digoxin 0.25 mg p.o. q.d. 7. PremPro as previously.  INSTRUCTIONS:  She is instructed no driving or heavy exertion.  DIET:  To maintain a low-salt, low-fat,  low-cholesterol diet.  To avoid caffeine or pseudoephedrine products.  FOLLOWUP:  She will have her PT and INR checked at the [facility] tomorrow morning at 9 a.m.  She was asked also to make a follow-up appointment with Dr. Gerrit Friends. Rothbart.  Dr. Sandria Manly will see her as needed. DD:  05/11/00 TD:  05/11/00 Job: [id number]

## 2010-10-03 NOTE — Consult Note (Signed)
[place]  Patient:    Tamara Hudson, Tamara Hudson                      MRN: [id number] Adm. Date:  [id number] Attending:  Nelta Numbers                          Consultation Report  PATIENTS ADDRESS:  [id number] [place], [place] [address].  DATE OF BIRTH:  05-20-1945  AGE:  65  REASON FOR CONSULTATION:  This 65 year old, right-handed, white, married female is seen at the request of Dr. Dietrich Pates for evaluation of left body symptoms with atrial fibrillation.  HISTORY OF PRESENT ILLNESS:  This patient has no known history of thyroid disease, alcoholism, high blood pressure, diabetes, heart disease, stroke, or cigarette use.  She went to bed on the evening of May 06, 2000, feeling well and awoke at approximately 5:30 a.m. to make coffee.  While making coffee, she noticed that she had a left wrist drop and then noted that her left arm had an upward motion that was involuntary at the left shoulder.  This was associated with left hand and arm and left face numbness without headache, syncope, seizure, chest pain, or palpitations.  She went upstairs upset to talk with her husband about the events.  Her symptomatology cleared within minutes, but exactly how long is not clear.  She had no prior history of similar episodes.  There is no known history of drug abuse, head or neck trauma.  Her alcohol use is approximately one drink per night.  She does not use cigarettes.  Her medications include Prempro one p.o., dosage unknown, q.d.  PAST MEDICAL HISTORY:  Significant for irregular menses.  REVIEW OF SYSTEMS:  Her medical review of systems is significant for recent shortness of breath and cough.  PHYSICAL EXAMINATION:  GENERAL:  Well-developed white female in no acute distress.  VITAL SIGNS:  Blood pressure in the right and left arms was 130/80 and 120/80. Her heart rate was 140 in atrial fibrillation.  There were no bruits. Respiratory  rate was 18.  MENTAL STATUS:  She was alert and oriented x 3.  CRANIAL NERVES:  Visual fields were full.  Disks were flat.  The extraocular movements were full, and corneas were present.  There was no seventh nerve palsy.  Pinprick was equal in the face.  Tongue was midline.  Uvula was midline.  Gags were present.  Sternocleidomastoid and trapezius testing was normal.  Motor examination revealed 5/5 strength in the upper and lower extremities.  Finger-to-nose and heel-to-shin and rapid alternating movements were normal.  Sensory examination was intact to pinprick, touch, _______, and vibration.  Deep tendon reflexes were 2+.  Plantar responses were downgoing. Gait examination was not evaluated.  X-RAY DATA:  A CT scan at [facility] was normal.  IMPRESSION: 1. Right brain transient ischemic attack, code 425.9. 2. Seizure, code 245.50. 3. Atrial fibrillation, code 427.31.  PLAN:  The plan at this time is to keep her on heparin; to keep her on seizure precautions; and to obtain MRI, 2-D, Doppler, and EEG. DD:  05/07/00 TD:  05/08/00 Job: [id number]

## 2010-10-29 ENCOUNTER — Other Ambulatory Visit: Payer: Self-pay | Admitting: Cardiology

## 2010-11-11 ENCOUNTER — Other Ambulatory Visit: Payer: Self-pay | Admitting: Cardiology

## 2010-11-27 ENCOUNTER — Other Ambulatory Visit: Payer: Self-pay | Admitting: Cardiology

## 2010-12-16 ENCOUNTER — Encounter: Payer: Self-pay | Admitting: Cardiology

## 2010-12-22 ENCOUNTER — Encounter: Payer: Self-pay | Admitting: Cardiology

## 2010-12-22 ENCOUNTER — Ambulatory Visit (INDEPENDENT_AMBULATORY_CARE_PROVIDER_SITE_OTHER): Payer: BC Managed Care – PPO | Admitting: Cardiology

## 2010-12-22 VITALS — BP 135/78 | HR 98 | Ht 65.0 in | Wt 175.0 lb

## 2010-12-22 DIAGNOSIS — I509 Heart failure, unspecified: Secondary | ICD-10-CM

## 2010-12-22 DIAGNOSIS — Z7901 Long term (current) use of anticoagulants: Secondary | ICD-10-CM | POA: Insufficient documentation

## 2010-12-22 DIAGNOSIS — Z0000 Encounter for general adult medical examination without abnormal findings: Secondary | ICD-10-CM

## 2010-12-22 DIAGNOSIS — F419 Anxiety disorder, unspecified: Secondary | ICD-10-CM

## 2010-12-22 DIAGNOSIS — I4891 Unspecified atrial fibrillation: Secondary | ICD-10-CM

## 2010-12-22 DIAGNOSIS — R0989 Other specified symptoms and signs involving the circulatory and respiratory systems: Secondary | ICD-10-CM

## 2010-12-22 DIAGNOSIS — I6350 Cerebral infarction due to unspecified occlusion or stenosis of unspecified cerebral artery: Secondary | ICD-10-CM

## 2010-12-22 NOTE — Patient Instructions (Signed)
Your physician recommends that you schedule a follow-up appointment in: 1 year Your physician recommends that you return for lab work in: today Stool cards-follow directions in packet

## 2010-12-22 NOTE — Progress Notes (Signed)
HPI : Tamara Hudson returns to the office as scheduled for continued assessment and treatment of paroxysmal atrial fibrillation.  Since her last visit, she has done generally well.  She reports rare episodes palpitations that are mild and brief.  She has not been hospitalized nor required urgent medical care.  She denies orthopnea, PND, lightheadedness, syncope or chest pain.  She has some exercise intolerance, but this is more due to fatigue and dyspnea.  She has had some mild pedal edema.  Her husband has been disabled, requiring a walker and having poor exercise tolerance, but able to drive, pursue his usual occupation and stay at home alone.  Patient reports substantial anxiety related to her increased duties at home.  For a while, she would not leave her husband for fear that he would fall, but now that he is using a walker, he is more stable.  She reports support from friends and efforts on her part to avoid overreacting to her husband's problems.  She is experiencing middle of the night awakenings, but this is not very distressing to her.  She has no history of snoring or sleep apnea.  Current Outpatient Prescriptions on File Prior to Visit  Medication Sig Dispense Refill  . flecainide (TAMBOCOR) 150 MG tablet TAKE 1 TABLET BY MOUTH TWICE A DAY  60 tablet  0  . verapamil (VERELAN PM) 360 MG 24 hr capsule TAKE ONE CAPSULE BY MOUTH DAILY  30 capsule  2  . warfarin (COUMADIN) 1 MG tablet Take 1 mg by mouth as directed.        . warfarin (COUMADIN) 5 MG tablet Take 5 mg by mouth as directed.           No Known Allergies    Past medical history, social history, and family history reviewed and updated.  ROS: See history of present illness.  PHYSICAL EXAM: BP 135/78  Pulse 98  Ht 5\' 5"  (1.651 m)  Wt 79.379 kg (175 lb)  BMI 29.12 kg/m2  SpO2 95% ; weight has increased 8 pounds since last year. General-Well developed; no acute distress Body habitus-overweight Neck-No JVD; no carotid  bruits Lungs-clear lung fields; resonant to percussion Cardiovascular-normal PMI; normal S1 and S2; fourth heart sound present; minimal systolic ejection murmur Abdomen-normal bowel sounds; soft and non-tender without masses or organomegaly Musculoskeletal-No deformities, no cyanosis or clubbing Neurologic-Normal cranial nerves; symmetric strength and tone Skin-Warm, no significant lesions Extremities-distal pulses intact; trace edema  EKG: normal sinus rhythm; borderline first-degree AV block; delayed R-wave progression; modest Q-T prolongation; nonspecific ST-T wave abnormalities.  Comparison with prior tracing of 10/08/08: Heart rate has increased; QT interval slightly increased; ST-T wave abnormalities more prominent.  ASSESSMENT AND PLAN:

## 2010-12-22 NOTE — Assessment & Plan Note (Signed)
Current symptoms suggest little, if any, recurrent atrial fibrillation.  Flexion that appears to be effective and will be continued.  A level was obtained following her last visit and was high therapeutic.

## 2010-12-22 NOTE — Assessment & Plan Note (Signed)
Patient has done very well on treatment with warfarin, which will be continued.  A CBC and stool for Hemoccult testing will be obtained to exclude occult GI blood loss.

## 2010-12-22 NOTE — Assessment & Plan Note (Signed)
Carotid ultrasound performed last year revealed very modest intimal thickening and atherosclerotic changes as well as a tortuous left carotid without evidence for significant stenosis.

## 2010-12-22 NOTE — Assessment & Plan Note (Signed)
Patient's dyspnea is likely related to excessive weight, deconditioning and anxiety.  She has no manifestations of congestive heart failure, which accompanied her initial episode of atrial fibrillation with a rapid ventricular rate.

## 2010-12-22 NOTE — Assessment & Plan Note (Signed)
Patient has never undergone colonoscopy.  She was advised to discuss the advisability of this procedure with her PCP at her next office visit.

## 2010-12-22 NOTE — Assessment & Plan Note (Signed)
No recurrence of neurologic symptoms since anticoagulation initiated.

## 2010-12-23 ENCOUNTER — Encounter: Payer: Self-pay | Admitting: *Deleted

## 2010-12-23 LAB — CBC WITH DIFFERENTIAL/PLATELET
Basophils Relative: 1 % (ref 0–1)
Eosinophils Relative: 1 % (ref 0–5)
HCT: 42.6 % (ref 36.0–46.0)
Lymphs Abs: 1.4 10*3/uL (ref 0.7–4.0)
Monocytes Absolute: 0.5 10*3/uL (ref 0.1–1.0)
Monocytes Relative: 7 % (ref 3–12)
Neutro Abs: 5.5 10*3/uL (ref 1.7–7.7)
Neutrophils Relative %: 74 % (ref 43–77)
RBC: 4.4 MIL/uL (ref 3.87–5.11)
WBC: 7.5 10*3/uL (ref 4.0–10.5)

## 2010-12-29 ENCOUNTER — Other Ambulatory Visit: Payer: Self-pay | Admitting: Cardiology

## 2011-01-21 ENCOUNTER — Encounter (INDEPENDENT_AMBULATORY_CARE_PROVIDER_SITE_OTHER): Payer: BC Managed Care – PPO

## 2011-01-21 DIAGNOSIS — Z7901 Long term (current) use of anticoagulants: Secondary | ICD-10-CM

## 2011-01-29 ENCOUNTER — Other Ambulatory Visit: Payer: Self-pay | Admitting: Cardiology

## 2011-02-09 ENCOUNTER — Other Ambulatory Visit: Payer: Self-pay | Admitting: Cardiology

## 2011-05-06 ENCOUNTER — Other Ambulatory Visit: Payer: Self-pay | Admitting: Cardiology

## 2011-07-15 IMAGING — CR DG CHEST 2V
2 series · 2 of 2 positions shown · non-contrast
Comparison: None available.

CLINICAL DATA: 63-year-old female shortness of breath, dizziness,
weakness.  Arrhythmia.

CHEST - 2 VIEW

[w chest pa]
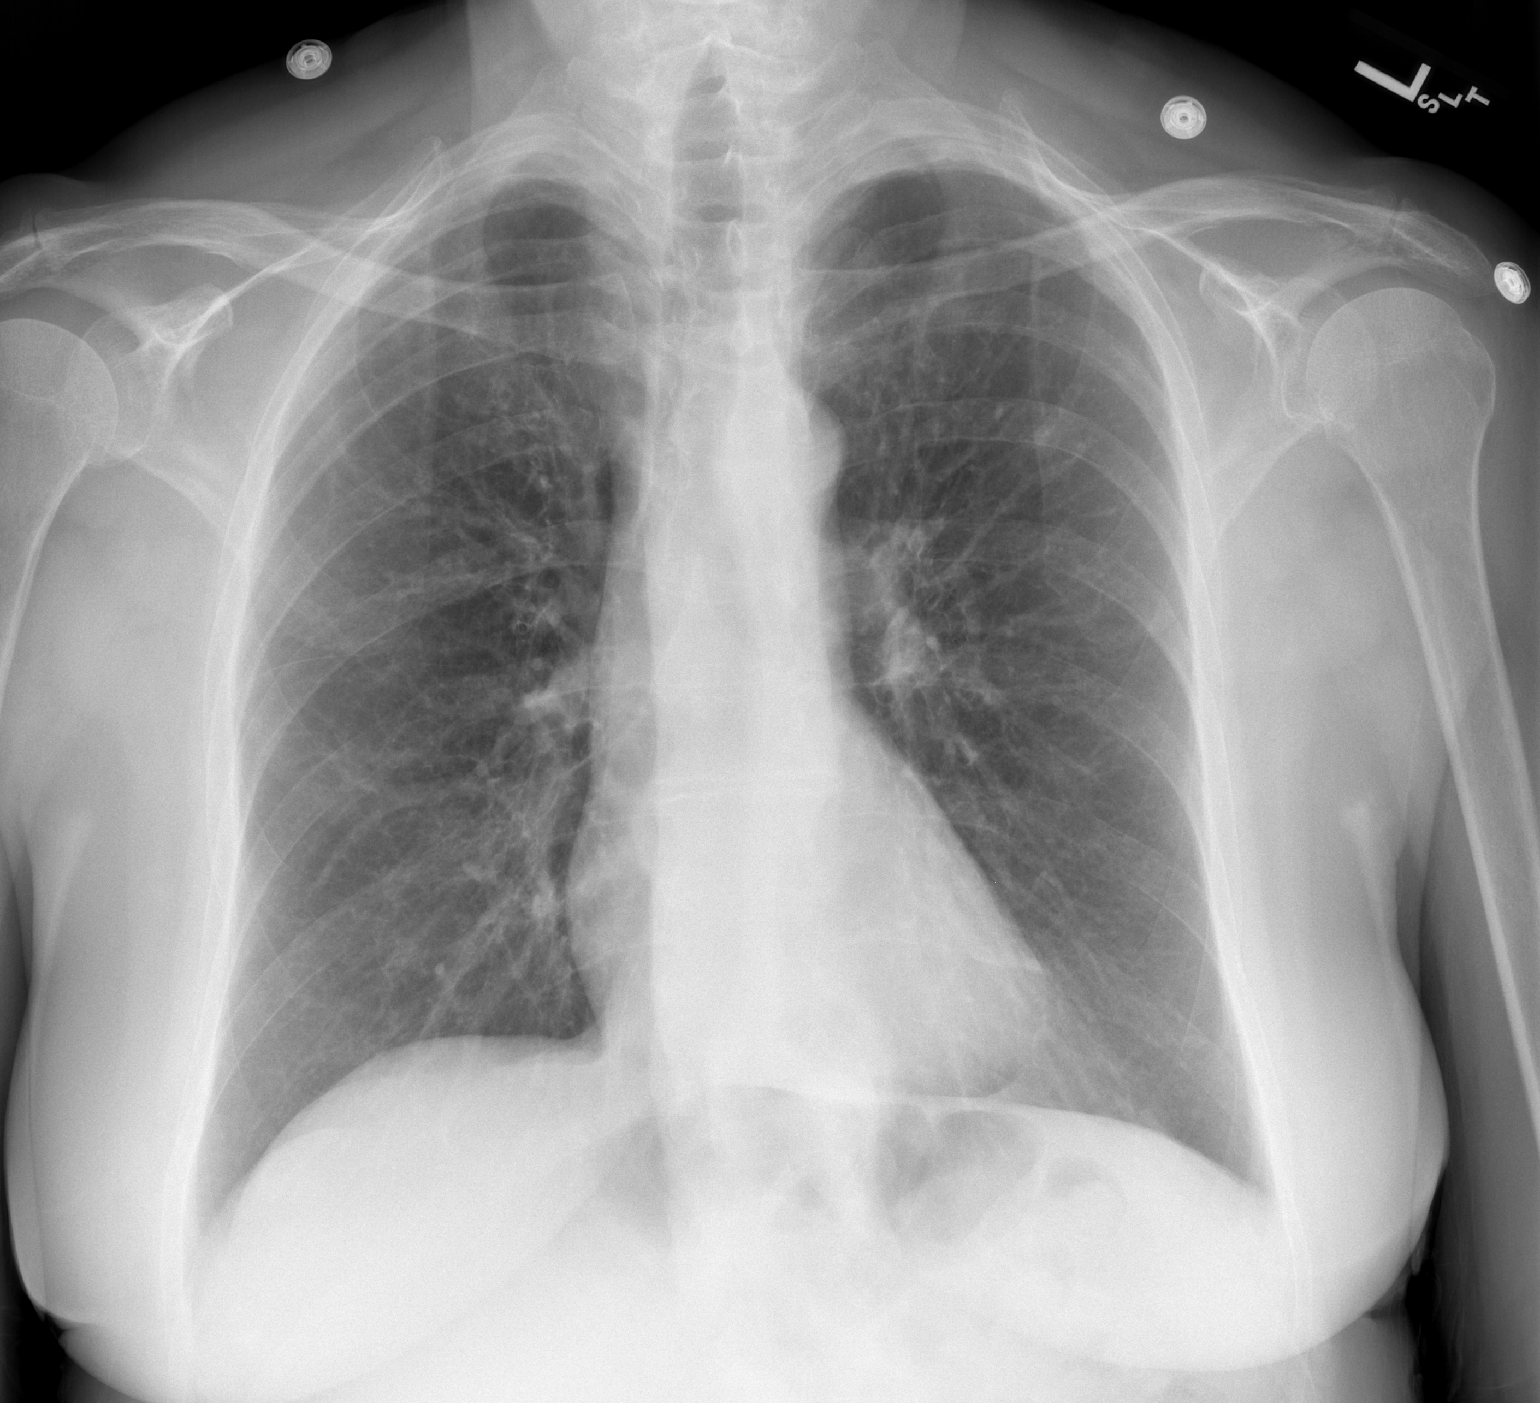

[w chest lat]
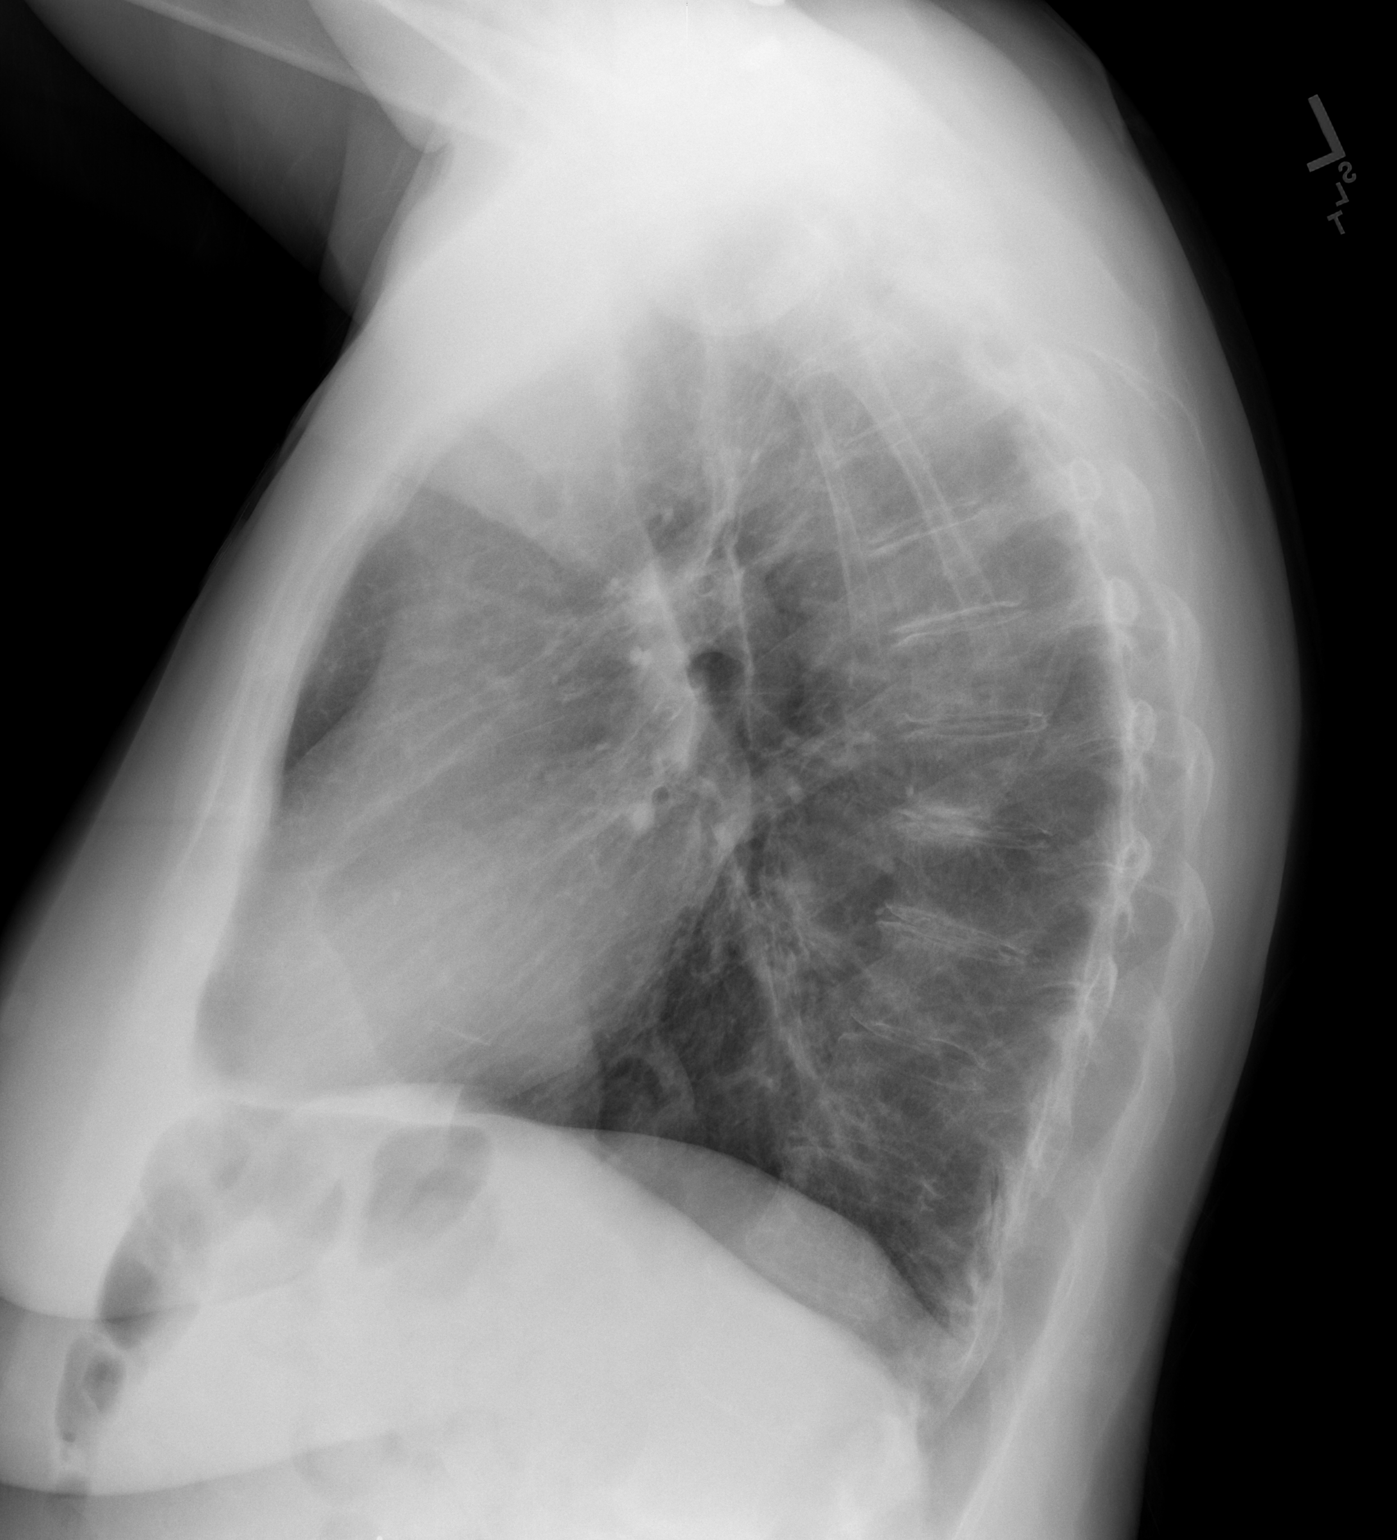

[2 of 2 positions shown; findings below may reference images not displayed]

FINDINGS: Cardiac size and mediastinal contours are within normal
limits.  Lung volumes are mildly increased.  No pneumothorax,
pulmonary edema, pleural effusion, consolidation or confluent
airspace opacity.  There is suggestion of some perihilar
bronchiectasis and nodularity, most conspicuous in the right
suprahilar region. Visualized tracheal air column is within normal
limits.  No acute osseous abnormality identified.
IMPRESSION: Some chronic changes in the lungs suspected, including possible
bronchiectasis in the right upper lobe. No definite acute
cardiopulmonary abnormality.

## 2011-08-03 ENCOUNTER — Other Ambulatory Visit: Payer: Self-pay | Admitting: Cardiology

## 2011-08-23 ENCOUNTER — Other Ambulatory Visit: Payer: Self-pay | Admitting: Cardiology

## 2011-10-25 ENCOUNTER — Other Ambulatory Visit: Payer: Self-pay | Admitting: Cardiology

## 2011-12-31 ENCOUNTER — Ambulatory Visit (INDEPENDENT_AMBULATORY_CARE_PROVIDER_SITE_OTHER): Payer: BC Managed Care – PPO | Admitting: Cardiology

## 2011-12-31 ENCOUNTER — Encounter: Payer: Self-pay | Admitting: Cardiology

## 2011-12-31 VITALS — BP 154/90 | HR 70 | Ht 65.0 in | Wt 172.4 lb

## 2011-12-31 DIAGNOSIS — Z7901 Long term (current) use of anticoagulants: Secondary | ICD-10-CM

## 2011-12-31 DIAGNOSIS — Z79899 Other long term (current) drug therapy: Secondary | ICD-10-CM

## 2011-12-31 DIAGNOSIS — R0989 Other specified symptoms and signs involving the circulatory and respiratory systems: Secondary | ICD-10-CM

## 2011-12-31 DIAGNOSIS — I4891 Unspecified atrial fibrillation: Secondary | ICD-10-CM

## 2011-12-31 NOTE — Progress Notes (Signed)
Patient ID: Tamara Hudson, female   DOB: May 09, 1946, 66 y.o.   MRN: [id number]  HPI: Scheduled return visit for this very nice woman with a long history of paroxysmal atrial fibrillation that has been well controlled with flecainide.  She initially presented with a non-disabling CVA, but has had no subsequent thromboembolic events while maintained on anticoagulation, which is managed by her PCP in [place].  Over the past year since her previous visit, she has done extremely well with no cardiopulmonary symptoms other than rare and fairly brief episodes of palpitations.  She has not had documented recurrent atrial fibrillation for some time.  Prior to Admission medications   Medication Sig Start Date End Date Taking? Authorizing Provider  flecainide (TAMBOCOR) 150 MG tablet TAKE 1 TABLET BY MOUTH TWICE A DAY 08/23/11  Yes Kathlen Brunswick, MD  verapamil (VERELAN PM) 360 MG 24 hr capsule TAKE ONE CAPSULE BY MOUTH DAILY 10/25/11  Yes Jodelle Gross, NP  warfarin (COUMADIN) 1 MG tablet Take 1 mg by mouth as directed.     Yes Historical Provider, MD  warfarin (COUMADIN) 5 MG tablet Take 5 mg by mouth as directed.     Yes Historical Provider, MD   No Known Allergies    Past medical history, social history, and family history reviewed and updated.  ROS: Denies chest discomfort, orthopnea, PND, pedal edema or syncope.  She has never undergone colonoscopy but has no family history for colon or rectal carcinoma.  PHYSICAL EXAM: BP 154/90  Pulse 70  Ht 5\' 5"  (1.651 m)  Wt 78.2 kg (172 lb 6.4 oz)  BMI 28.69 kg/m2  SpO2 98% ; repeat blood pressure 155/80 in the left arm sitting General-Well developed; no acute distress Body habitus-Mildly overweight Neck-No JVD; no carotid bruits Lungs-clear lung fields; resonant to percussion Cardiovascular-normal PMI; normal S1 and S2; modest basilar systolic murmur; regular rhythm Abdomen-normal bowel sounds; soft and non-tender without masses or  organomegaly Musculoskeletal-No deformities, no cyanosis or clubbing Neurologic-Normal cranial nerves; symmetric strength and tone Skin-Warm, no significant lesions Extremities-distal pulses intact; no edema  ASSESSMENT AND PLAN:  [place] Bing, MD 12/31/2011 12:31 PM

## 2011-12-31 NOTE — Assessment & Plan Note (Signed)
Carotid bruits not appreciated on examination today.  Carotid ultrasound revealed minimal disease in 2011.  In the absence of more prominent physical findings or symptoms, serial imaging is probably not necessary.

## 2011-12-31 NOTE — Progress Notes (Deleted)
Name: Tamara Hudson    DOB: [birth year]  Age: 66 y.o.  MR#: [id number]       PCP:  Joycelyn Rua, MD      Insurance: @PAYORNAME @   CC:    Chief Complaint  Patient presents with  . Atrial Fibrillation    VS BP 154/90  Pulse 70  Ht 5\' 5"  (1.651 m)  Wt 172 lb 6.4 oz (78.2 kg)  BMI 28.69 kg/m2  SpO2 98%  Weights Current Weight  12/31/11 172 lb 6.4 oz (78.2 kg)  12/22/10 175 lb (79.379 kg)  12/16/09 167 lb (75.751 kg)    Blood Pressure  BP Readings from Last 3 Encounters:  12/31/11 154/90  12/22/10 135/78  12/16/09 143/73     Admit date:  (Not on file) Last encounter with RMR:  10/25/2011   Allergy No Known Allergies  Current Outpatient Prescriptions  Medication Sig Dispense Refill  . flecainide (TAMBOCOR) 150 MG tablet TAKE 1 TABLET BY MOUTH TWICE A DAY  60 tablet  6  . verapamil (VERELAN PM) 360 MG 24 hr capsule TAKE ONE CAPSULE BY MOUTH DAILY  30 capsule  2  . warfarin (COUMADIN) 1 MG tablet Take 1 mg by mouth as directed.        . warfarin (COUMADIN) 5 MG tablet Take 5 mg by mouth as directed.          Discontinued Meds:   There are no discontinued medications.  Patient Active Problem List  Diagnosis  . PAROXYSMAL ATRIAL FIBRILLATION  . CHF  . CVA  . CAROTID BRUITS, BILATERAL  . Anxiety  . Routine health maintenance  . Chronic anticoagulation    LABS No visits with results within 3 Month(s) from this visit. Latest known visit with results is:  Office Visit on 12/22/2010  Component Date Value  . WBC 12/22/2010 7.5   . RBC 12/22/2010 4.40   . Hemoglobin 12/22/2010 14.4   . HCT 12/22/2010 42.6   . MCV 12/22/2010 96.8   . [facility] 12/22/2010 32.7   . MCHC 12/22/2010 33.8   . RDW 12/22/2010 14.1   . Platelets 12/22/2010 234   . Neutrophils Relative 12/22/2010 74   . Neutro Abs 12/22/2010 5.5   . Lymphocytes Relative 12/22/2010 18   . Lymphs Abs 12/22/2010 1.4   . Monocytes Relative 12/22/2010 7   . Monocytes Absolute 12/22/2010 0.5   . Eosinophils  Relative 12/22/2010 1   . Eosinophils Absolute 12/22/2010 0.1   . Basophils Relative 12/22/2010 1   . Basophils Absolute 12/22/2010 0.0   . Smear Review 12/22/2010 Criteria for review not met      Results for this Opt Visit:     Results for orders placed in visit on 12/22/10  CBC WITH DIFFERENTIAL      Component Value Range   WBC 7.5  4.0 - 10.5 K/uL   RBC 4.40  3.87 - 5.11 MIL/uL   Hemoglobin 14.4  12.0 - 15.0 g/dL   HCT 40.9  81.1 - 91.4 %   MCV 96.8  78.0 - 100.0 fL   MCH 32.7  26.0 - 34.0 pg   MCHC 33.8  30.0 - 36.0 g/dL   RDW 78.2  95.6 - 21.3 %   Platelets 234  150 - 400 K/uL   Neutrophils Relative 74  43 - 77 %   Neutro Abs 5.5  1.7 - 7.7 K/uL   Lymphocytes Relative 18  12 - 46 %   Lymphs Abs 1.4  0.7 -  4.0 K/uL   Monocytes Relative 7  3 - 12 %   Monocytes Absolute 0.5  0.1 - 1.0 K/uL   Eosinophils Relative 1  0 - 5 %   Eosinophils Absolute 0.1  0.0 - 0.7 K/uL   Basophils Relative 1  0 - 1 %   Basophils Absolute 0.0  0.0 - 0.1 K/uL   Smear Review Criteria for review not met      EKG Orders placed in visit on 12/22/10  . EKG 12-LEAD     Prior Assessment and Plan Problem List as of 12/31/2011            Cardiology Problems   PAROXYSMAL ATRIAL FIBRILLATION   Last Assessment & Plan Note   12/22/2010 Office Visit Signed 12/22/2010  2:29 PM by Kathlen Brunswick, MD    Current symptoms suggest little, if any, recurrent atrial fibrillation.  Flexion that appears to be effective and will be continued.  A level was obtained following her last visit and was high therapeutic.    CHF   Last Assessment & Plan Note   12/22/2010 Office Visit Signed 12/22/2010  2:27 PM by Kathlen Brunswick, MD    Patient's dyspnea is likely related to excessive weight, deconditioning and anxiety.  She has no manifestations of congestive heart failure, which accompanied her initial episode of atrial fibrillation with a rapid ventricular rate.    CVA   Last Assessment & Plan Note   12/22/2010 Office  Visit Signed 12/22/2010  2:28 PM by Kathlen Brunswick, MD    No recurrence of neurologic symptoms since anticoagulation initiated.      Other   CAROTID BRUITS, BILATERAL   Last Assessment & Plan Note   12/22/2010 Office Visit Signed 12/22/2010  2:26 PM by Kathlen Brunswick, MD    Carotid ultrasound performed last year revealed very modest intimal thickening and atherosclerotic changes as well as a tortuous left carotid without evidence for significant stenosis.    Anxiety   Routine health maintenance   Last Assessment & Plan Note   12/22/2010 Office Visit Signed 12/22/2010  2:30 PM by Kathlen Brunswick, MD    Patient has never undergone colonoscopy.  She was advised to discuss the advisability of this procedure with her PCP at her next office visit.    Chronic anticoagulation   Last Assessment & Plan Note   12/22/2010 Office Visit Signed 12/22/2010  2:28 PM by Kathlen Brunswick, MD    Patient has done very well on treatment with warfarin, which will be continued.  A CBC and stool for Hemoccult testing will be obtained to exclude occult GI blood loss.        Imaging: No results found.   FRS Calculation: Score not calculated. Missing: Total Cholesterol, HDL

## 2011-12-31 NOTE — Assessment & Plan Note (Signed)
No evidence for occult GI blood loss, but she was advised to discuss an initial screening colonoscopy with her PCP.  Recent basic laboratory tests will be obtained from his office and reviewed.  Patient has moved within [place] and is now more than one hour from this office.  She will followup next year with Dr. Jens Som in our office that is only minutes from her home.

## 2011-12-31 NOTE — Assessment & Plan Note (Signed)
Atrial fibrillation continues to be well controlled, at least on a symptomatic basis.  Therapy with flecainide has appeared to be without adverse effect.  A trough level will be measured.

## 2011-12-31 NOTE — Patient Instructions (Addendum)
Your physician recommends that you schedule a follow-up appointment in:  1 year in [place] office with Dr Jens Som  Your physician has requested that you regularly monitor and record your blood pressure readings at home. Please use the same machine at the same time of day to check your readings and mail these to [place] at [address] c/o [place], [place] [address].  Your physician recommends that you return for lab work in: Today  Stools x 3 and return to our office as soon as possible

## 2012-01-15 ENCOUNTER — Encounter (INDEPENDENT_AMBULATORY_CARE_PROVIDER_SITE_OTHER): Payer: BC Managed Care – PPO

## 2012-01-15 ENCOUNTER — Other Ambulatory Visit (INDEPENDENT_AMBULATORY_CARE_PROVIDER_SITE_OTHER): Payer: BC Managed Care – PPO | Admitting: *Deleted

## 2012-01-15 DIAGNOSIS — R0989 Other specified symptoms and signs involving the circulatory and respiratory systems: Secondary | ICD-10-CM

## 2012-01-15 DIAGNOSIS — Z7901 Long term (current) use of anticoagulants: Secondary | ICD-10-CM

## 2012-01-15 LAB — POC HEMOCCULT BLD/STL (OFFICE/1-CARD/DIAGNOSTIC)
Card #2 Fecal Occult Blod, POC: NEGATIVE
Card #3 Fecal Occult Blood, POC: NEGATIVE
Fecal Occult Blood, POC: NEGATIVE

## 2012-01-17 ENCOUNTER — Encounter: Payer: Self-pay | Admitting: Cardiology

## 2012-01-19 ENCOUNTER — Other Ambulatory Visit: Payer: Self-pay | Admitting: Cardiology

## 2012-01-23 ENCOUNTER — Encounter: Payer: Self-pay | Admitting: Cardiology

## 2012-01-23 LAB — FLECAINIDE LEVEL: Flecainide: 0.69 ug/mL (ref 0.20–1.00)

## 2012-01-29 ENCOUNTER — Other Ambulatory Visit: Payer: Self-pay | Admitting: *Deleted

## 2012-01-29 MED ORDER — VERAPAMIL HCL ER 360 MG PO CP24: 360.0000 mg | ORAL_CAPSULE | Freq: Every day | ORAL | Status: DC

## 2012-02-02 ENCOUNTER — Encounter: Payer: Self-pay | Admitting: *Deleted

## 2012-02-04 ENCOUNTER — Telehealth: Payer: Self-pay | Admitting: Cardiology

## 2012-02-04 NOTE — Telephone Encounter (Signed)
PT STATES SHE RECEIVED AT LETTER THAT STOOL CARDS WERE DUE. SHE CALLED TO LET us KNOW SHE HAD ALREADY MAILED THEM. A ENCOUNTER WAS MADE ON 01/15/12 FOR ONE.

## 2012-03-23 ENCOUNTER — Other Ambulatory Visit: Payer: Self-pay | Admitting: Cardiology

## 2012-09-01 ENCOUNTER — Other Ambulatory Visit: Payer: Self-pay

## 2012-09-01 ENCOUNTER — Other Ambulatory Visit: Payer: Self-pay | Admitting: Adult Health

## 2012-09-01 MED ORDER — VERAPAMIL HCL ER 360 MG PO CP24: 360.0000 mg | ORAL_CAPSULE | Freq: Every day | ORAL | Status: DC

## 2012-10-20 ENCOUNTER — Other Ambulatory Visit: Payer: Self-pay | Admitting: Cardiology

## 2013-01-05 ENCOUNTER — Ambulatory Visit: Payer: BC Managed Care – PPO | Admitting: Cardiovascular Disease

## 2013-01-19 ENCOUNTER — Ambulatory Visit: Payer: BC Managed Care – PPO | Admitting: Adult Health

## 2013-02-02 ENCOUNTER — Encounter: Payer: Self-pay | Admitting: Adult Health

## 2013-02-02 ENCOUNTER — Ambulatory Visit (INDEPENDENT_AMBULATORY_CARE_PROVIDER_SITE_OTHER): Payer: BC Managed Care – PPO | Admitting: Adult Health

## 2013-02-02 VITALS — BP 189/97 | HR 64 | Ht 65.0 in | Wt 175.2 lb

## 2013-02-02 DIAGNOSIS — I4891 Unspecified atrial fibrillation: Secondary | ICD-10-CM

## 2013-02-02 DIAGNOSIS — R0989 Other specified symptoms and signs involving the circulatory and respiratory systems: Secondary | ICD-10-CM

## 2013-02-02 DIAGNOSIS — I509 Heart failure, unspecified: Secondary | ICD-10-CM

## 2013-02-02 MED ORDER — FLECAINIDE ACETATE 150 MG PO TABS: 150.0000 mg | ORAL_TABLET | Freq: Two times a day (BID) | ORAL | Status: DC

## 2013-02-02 MED ORDER — VERAPAMIL HCL ER 360 MG PO CP24: 360.0000 mg | ORAL_CAPSULE | Freq: Every day | ORAL | Status: DC

## 2013-02-02 NOTE — Patient Instructions (Addendum)
Your physician recommends that you schedule a follow-up appointment in: ONE YEAR WITH DR Jens Som IN [place]

## 2013-02-02 NOTE — Assessment & Plan Note (Signed)
No evidence of fluid overload. She will continue current medications as directed. F/u echo can be completed on visit with Dr. Jens Som at his discretion.

## 2013-02-02 NOTE — Assessment & Plan Note (Signed)
Remains in NSR with prolonged QT interval. 480 ms noted. She is to continued on anticoagulation therapy with Dr. Izola Price in [place]. She will follow up with Dr. Jens Som in [place] office in one year unless she is symptomatic. Refills are provided on flecainide and verapamil.

## 2013-02-02 NOTE — Progress Notes (Signed)
   HPI: Tamara Hudson is a 67 year old former patient of Dr. Dietrich Pates we are following for a ongoing assessment and management of paroxysmal atrial fibrillation, controlled with flecainide. Patient has a history of a nondisabling CVA, and remains on chronic Coumadin therapy. Last seen in the office on 01/04/2012. Fibrillation was rate controlled, she was continued on current medication, remained on anticoagulation.    She is driving from [place] to see [place] and is a little stressed from the drive reflecting in her BP. She has not had any ER visits, admissions, surgeries, or new diagnoses since being seen last. She denies chest pain, palpitations or DOE. She is followed by  Dr. Silvestre Moment for coumadin dosing and INR checks at [facility] [place].        No Known Allergies  Current Outpatient Prescriptions  Medication Sig Dispense Refill  . flecainide (TAMBOCOR) 150 MG tablet TAKE 1 TABLET BY MOUTH TWICE A DAY  60 tablet  4  . verapamil (VERELAN PM) 360 MG 24 hr capsule Take 1 capsule (360 mg total) by mouth at bedtime.  30 capsule  6  . warfarin (COUMADIN) 1 MG tablet Take 1 mg by mouth as directed.        . warfarin (COUMADIN) 5 MG tablet Take 5 mg by mouth as directed.         No current facility-administered medications for this visit.    Past Medical History  Diagnosis Date  . PAF (paroxysmal atrial fibrillation)     with difficult to control rate-presented in 2001 with CHF; DCCV- 2002. symptomatic bradycardia with low dose beta blocker.   . CHF (congestive heart failure)     neg stress nuclear study in 2/06; boarderline  EF   . CVA (cerebral vascular accident)     right parietal embolic CVA in 12/01  . Chronic anticoagulation     dose adjusted by PMD  . Hypokalemia     Past Surgical History  Procedure Laterality Date  . Cholecystectomy    . Tonsillectomy and adenoidectomy      UEA:VWUJWJ of systems complete and found to be negative unless listed above  PHYSICAL EXAM BP  189/97  Pulse 64  Ht 5\' 5"  (1.651 m)  Wt 175 lb 4 oz (79.493 kg)  BMI 29.16 kg/m2  General: Well developed, well nourished, in no acute distress Head: Eyes PERRLA, No xanthomas.   Normal cephalic and atramatic  Lungs: Clear bilaterally to auscultation and percussion. Heart: HRRR S1 S2, without MRG.  Pulses are 2+ & equal.            No carotid bruit. No JVD.  No abdominal bruits. No femoral bruits. Abdomen: Bowel sounds are positive, abdomen soft and non-tender without masses or                  Hernia's noted. Msk:  Back normal, normal gait. Normal strength and tone for age. Extremities: No clubbing, cyanosis or edema.  DP +1 Neuro: Alert and oriented X 3. Psych:  Good affect, responds appropriately  EKG: NSR with 1 st degree AV block. QT interval 480 ms.  ASSESSMENT AND PLAN

## 2013-02-02 NOTE — Progress Notes (Deleted)
Name: Tamara Hudson    DOB: 01/09/1946  Age: 66 y.o.  MR#: [id number]       PCP:  Joycelyn Rua, MD      Insurance: Payor: BLUE CROSS BLUE SHIELD / Plan: BCBS [place] PPO / Product Type: *No Product type* /   CC:    Chief Complaint  Patient presents with  . Atrial Fibrillation  PT NOTES BP NORMALLY NOT THIS HIGH HOWEVER IN A RUSH TO GET TO OFFICE TODAY  VS Filed Vitals:   02/02/13 1320  BP: 189/97  Pulse: 64  Height: 5\' 5"  (1.651 m)  Weight: 175 lb 4 oz (79.493 kg)    Weights Current Weight  02/02/13 175 lb 4 oz (79.493 kg)  12/31/11 172 lb 6.4 oz (78.2 kg)  12/22/10 175 lb (79.379 kg)    Blood Pressure  BP Readings from Last 3 Encounters:  02/02/13 189/97  12/31/11 154/90  12/22/10 135/78     Admit date:  (Not on file) Last encounter with RMR:  09/01/2012   Allergy Review of patient's allergies indicates no known allergies.  Current Outpatient Prescriptions  Medication Sig Dispense Refill  . flecainide (TAMBOCOR) 150 MG tablet TAKE 1 TABLET BY MOUTH TWICE A DAY  60 tablet  4  . verapamil (VERELAN PM) 360 MG 24 hr capsule Take 1 capsule (360 mg total) by mouth at bedtime.  30 capsule  6  . warfarin (COUMADIN) 1 MG tablet Take 1 mg by mouth as directed.        . warfarin (COUMADIN) 5 MG tablet Take 5 mg by mouth as directed.         No current facility-administered medications for this visit.    Discontinued Meds:   There are no discontinued medications.  Patient Active Problem List   Diagnosis Date Noted  . Anxiety 12/22/2010  . Chronic anticoagulation 12/22/2010  . CAROTID BRUITS, BILATERAL 12/16/2009  . PAROXYSMAL ATRIAL FIBRILLATION 10/05/2008  . CHF 10/05/2008  . CVA 10/05/2008    LABS    Component Value Date/Time   NA 142 06/20/2009 2205   NA 142 09/15/2007   K 4.6 06/20/2009 2205   K 4.8 09/15/2007   CL 110 06/20/2009 2205   CL 104 09/15/2007   CO2 25 06/20/2009 2205   CO2 22 09/15/2007   GLUCOSE 113* 06/20/2009 2205   GLUCOSE 90 09/15/2007   BUN 13  06/20/2009 2205   BUN 12 09/15/2007   CREATININE 0.73 06/20/2009 2205   CREATININE 0.78 09/15/2007   CALCIUM 9.1 06/20/2009 2205   CALCIUM 9.5 09/15/2007   GFRNONAA >60 06/20/2009 2205   GFRAA  Value: >60        The eGFR has been calculated using the MDRD equation. This calculation has not been validated in all clinical situations. eGFR's persistently <60 mL/min signify possible Chronic Kidney Disease. 06/20/2009 2205   CMP     Component Value Date/Time   NA 142 06/20/2009 2205   K 4.6 06/20/2009 2205   CL 110 06/20/2009 2205   CO2 25 06/20/2009 2205   GLUCOSE 113* 06/20/2009 2205   BUN 13 06/20/2009 2205   CREATININE 0.73 06/20/2009 2205   CALCIUM 9.1 06/20/2009 2205   PROT 7.7 09/15/2007   ALBUMIN 4.7 09/15/2007   AST 14 09/15/2007   ALT 11 09/15/2007   ALKPHOS 81 09/15/2007   GFRNONAA >60 06/20/2009 2205   GFRAA  Value: >60        The eGFR has been calculated using the MDRD equation.  This calculation has not been validated in all clinical situations. eGFR's persistently <60 mL/min signify possible Chronic Kidney Disease. 06/20/2009 2205       Component Value Date/Time   WBC 7.5 12/22/2010 1404   WBC 7.3 12/16/2009 2255   WBC 7.3 12/16/2009   HGB 14.4 12/22/2010 1404   HGB 14.8 12/16/2009 2255   HGB 14.8 12/16/2009   HCT 42.6 12/22/2010 1404   HCT 42.4 12/16/2009 2255   HCT 42.4 12/16/2009   MCV 96.8 12/22/2010 1404   MCV 95.5 12/16/2009 2255   MCV 95.5 12/16/2009    Lipid Panel  No results found for this basename: chol, trig, hdl, cholhdl, vldl, ldlcalc    ABG No results found for this basename: phart, pco2, pco2art, po2, po2art, hco3, tco2, acidbasedef, o2sat     No results found for this basename: TSH   BNP (last 3 results) No results found for this basename: PROBNP,  in the last 8760 hours Cardiac Panel (last 3 results) No results found for this basename: CKTOTAL, CKMB, TROPONINI, RELINDX,  in the last 72 hours  Iron/TIBC/Ferritin No results found for this basename: iron, tibc, ferritin     EKG Orders placed  in visit on 02/02/13  . EKG 12-LEAD     Prior Assessment and Plan Problem List as of 02/02/2013   PAROXYSMAL ATRIAL FIBRILLATION   Last Assessment & Plan   12/31/2011 Office Visit Written 12/31/2011 12:37 PM by Kathlen Brunswick, MD     Atrial fibrillation continues to be well controlled, at least on a symptomatic basis.  Therapy with flecainide has appeared to be without adverse effect.  A trough level will be measured.    CHF   Last Assessment & Plan   12/22/2010 Office Visit Written 12/22/2010  2:27 PM by Kathlen Brunswick, MD     Patient's dyspnea is likely related to excessive weight, deconditioning and anxiety.  She has no manifestations of congestive heart failure, which accompanied her initial episode of atrial fibrillation with a rapid ventricular rate.    CVA   Last Assessment & Plan   12/22/2010 Office Visit Written 12/22/2010  2:28 PM by Kathlen Brunswick, MD     No recurrence of neurologic symptoms since anticoagulation initiated.    CAROTID BRUITS, BILATERAL   Last Assessment & Plan   12/31/2011 Office Visit Written 12/31/2011 12:40 PM by Kathlen Brunswick, MD     Carotid bruits not appreciated on examination today.  Carotid ultrasound revealed minimal disease in 2011.  In the absence of more prominent physical findings or symptoms, serial imaging is probably not necessary.    Anxiety   Chronic anticoagulation   Last Assessment & Plan   12/31/2011 Office Visit Written 12/31/2011 12:39 PM by Kathlen Brunswick, MD     No evidence for occult GI blood loss, but she was advised to discuss an initial screening colonoscopy with her PCP.  Recent basic laboratory tests will be obtained from his office and reviewed.  Patient has moved within [place] and is now more than one hour from this office.  She will followup next year with Dr. Jens Som in our office that is only minutes from her home.        Imaging: No results found.

## 2013-06-27 ENCOUNTER — Other Ambulatory Visit: Payer: Self-pay | Admitting: Adult Health

## 2013-12-11 ENCOUNTER — Other Ambulatory Visit: Payer: Self-pay | Admitting: Adult Health

## 2014-02-02 ENCOUNTER — Encounter: Payer: Self-pay | Admitting: Adult Health

## 2014-02-02 ENCOUNTER — Ambulatory Visit (INDEPENDENT_AMBULATORY_CARE_PROVIDER_SITE_OTHER): Payer: BC Managed Care – PPO | Admitting: Adult Health

## 2014-02-02 VITALS — BP 152/76 | HR 80 | Ht 65.0 in | Wt 184.0 lb

## 2014-02-02 DIAGNOSIS — I4891 Unspecified atrial fibrillation: Secondary | ICD-10-CM

## 2014-02-02 DIAGNOSIS — R0989 Other specified symptoms and signs involving the circulatory and respiratory systems: Secondary | ICD-10-CM

## 2014-02-02 DIAGNOSIS — I509 Heart failure, unspecified: Secondary | ICD-10-CM

## 2014-02-02 DIAGNOSIS — E785 Hyperlipidemia, unspecified: Secondary | ICD-10-CM

## 2014-02-02 DIAGNOSIS — I480 Paroxysmal atrial fibrillation: Secondary | ICD-10-CM

## 2014-02-02 LAB — PROTIME-INR
INR: 2.49 — ABNORMAL HIGH (ref <1.50)
Prothrombin Time: 26.9 seconds — ABNORMAL HIGH (ref 11.6–15.2)

## 2014-02-02 MED ORDER — VERAPAMIL HCL ER 360 MG PO CP24: 360.0000 mg | ORAL_CAPSULE | Freq: Every day | ORAL | Status: AC

## 2014-02-02 MED ORDER — FLECAINIDE ACETATE 150 MG PO TABS: 150.0000 mg | ORAL_TABLET | Freq: Two times a day (BID) | ORAL | Status: AC

## 2014-02-02 MED ORDER — WARFARIN SODIUM 5 MG PO TABS: 5.0000 mg | ORAL_TABLET | ORAL | Status: AC

## 2014-02-02 MED ORDER — WARFARIN SODIUM 1 MG PO TABS: 1.0000 mg | ORAL_TABLET | ORAL | Status: AC

## 2014-02-02 NOTE — Assessment & Plan Note (Signed)
No overt decompensation. Lungs are clear. No changes in her medication at this time.

## 2014-02-02 NOTE — Progress Notes (Signed)
    HPI: Tamara Hudson is a 68 year old female patient to be est. with either Dr. Purvis Sheffield or Dr.Branch that we follow for ongoing assessment and management of paroxysmal atrial fibrillation, controlled with flecainide with ongoing anticoagulation therapy. She is here for annual followup. She was last seen in our office in September of 2014. She is followed by primary care physician, Dr. Silvestre Moment for Coumadin dosing. She normally is followed in [place] by Dr. Jens Som as this is closer to her home, but she will be moving to live in a [place] to be near her daughter within the next couple of weeks.  She is already going to be est. with her daughters cardiologist when she gets there. She will need to have records sent. The patient has been without complaint of rapid palpitations, chest pain, shortness of breath, or fatigue. She is medically compliant. She has not had labs drawn in over a year.   No Known Allergies  Current Outpatient Prescriptions  Medication Sig Dispense Refill  . flecainide (TAMBOCOR) 150 MG tablet Take 1 tablet (150 mg total) by mouth 2 (two) times daily.  60 tablet  3  . verapamil (VERELAN PM) 360 MG 24 hr capsule Take 1 capsule (360 mg total) by mouth at bedtime.  60 capsule  3  . warfarin (COUMADIN) 1 MG tablet Take 1 tablet (1 mg total) by mouth as directed.  60 tablet  3  . warfarin (COUMADIN) 5 MG tablet Take 1 tablet (5 mg total) by mouth as directed.  60 tablet  3   No current facility-administered medications for this visit.    Past Medical History  Diagnosis Date  . PAF (paroxysmal atrial fibrillation)     with difficult to control rate-presented in 2001 with CHF; DCCV- 2002. symptomatic bradycardia with low dose beta blocker.   . CHF (congestive heart failure)     neg stress nuclear study in 2/06; boarderline  EF   . CVA (cerebral vascular accident)     right parietal embolic CVA in 12/01  . Chronic anticoagulation     dose adjusted by PMD  .  Hypokalemia     Past Surgical History  Procedure Laterality Date  . Cholecystectomy    . Tonsillectomy and adenoidectomy      ROS: Review of systems complete and found to be negative unless listed above  PHYSICAL EXAM BP 152/76  Pulse 80  Ht  (1.651 m)  Wt 184 lb (83.462 kg)  BMI 30.62 kg/m2 General: Well developed, well nourished, in no acute distress, pleasant woman Head: Eyes PERRLA, No xanthomas.   Normal cephalic and atramatic  Lungs: Clear bilaterally to auscultation and percussion. Heart: HRRR S1 S2, with 1/6 systolic murmur with preserved S2.  Pulses are 2+ & equal.            No carotid bruit. No JVD.  No abdominal bruits. No femoral bruits. Abdomen: Bowel sounds are positive, abdomen soft and non-tender without masses or                  Hernia's noted. Msk:  Back normal, normal gait. Normal strength and tone for age. Extremities: No clubbing, cyanosis or edema.  DP +1 Neuro: Alert and oriented X 3. Psych:  Good affect, responds appropriately   EKG:  Normal sinus rhythm with first degree AV block, PR interval 0.2-6, QTC 0.495, heart rate of 73 beats per minute  ASSESSMENT AND PLAN

## 2014-02-02 NOTE — Progress Notes (Deleted)
Name: Tamara Hudson    DOB: 1945-05-20  Age: 68 y.o.  MR#: [id number]       PCP:  Orpah Melter, MD      Insurance: Payor: [place] / Plan: [employer] [place] PPO / Product Type: *No Product type* /   CC:    Chief Complaint  Patient presents with  . Atrial Fibrillation    VS Filed Vitals:   02/02/14 1315  BP: 152/76  Pulse: 80  Height: $Remove'5\' 5"'MFqySzR$  (1.651 m)  Weight: 184 lb (83.462 kg)    Weights Current Weight  02/02/14 184 lb (83.462 kg)  02/02/13 175 lb 4 oz (79.493 kg)  12/31/11 172 lb 6.4 oz (78.2 kg)    Blood Pressure  BP Readings from Last 3 Encounters:  02/02/14 152/76  02/02/13 189/97  12/31/11 154/90     Admit date:  (Not on file) Last encounter with RMR:  12/11/2013   Allergy Review of patient's allergies indicates no known allergies.  Current Outpatient Prescriptions  Medication Sig Dispense Refill  . flecainide (TAMBOCOR) 150 MG tablet Take 1 tablet (150 mg total) by mouth 2 (two) times daily.  60 tablet  11  . verapamil (VERELAN PM) 360 MG 24 hr capsule Take 1 capsule (360 mg total) by mouth at bedtime.  30 capsule  11  . warfarin (COUMADIN) 1 MG tablet Take 1 mg by mouth as directed.        . warfarin (COUMADIN) 5 MG tablet Take 5 mg by mouth as directed.         No current facility-administered medications for this visit.    Discontinued Meds:    Medications Discontinued During This Encounter  Medication Reason  . verapamil (VERELAN PM) 360 MG 24 hr capsule Error    Patient Active Problem List   Diagnosis Date Noted  . Anxiety 12/22/2010  . Chronic anticoagulation 12/22/2010  . CAROTID BRUITS, BILATERAL 12/16/2009  . PAROXYSMAL ATRIAL FIBRILLATION 10/05/2008  . CHF 10/05/2008  . CVA 10/05/2008    LABS    Component Value Date/Time   NA 142 06/20/2009 2205   NA 142 09/15/2007   K 4.6 06/20/2009 2205   K 4.8 09/15/2007   CL 110 06/20/2009 2205   CL 104 09/15/2007   CO2 25 06/20/2009 2205   CO2 22 09/15/2007   GLUCOSE 113* 06/20/2009 2205   GLUCOSE 90 09/15/2007   BUN 13 06/20/2009 2205   BUN 12 09/15/2007   CREATININE 0.73 06/20/2009 2205   CREATININE 0.78 09/15/2007   CALCIUM 9.1 06/20/2009 2205   CALCIUM 9.5 09/15/2007   GFRNONAA >60 06/20/2009 2205   GFRAA  Value: >60        The eGFR has been calculated using the MDRD equation. This calculation has not been validated in all clinical situations. eGFR's persistently <60 mL/min signify possible Chronic Kidney Disease. 06/20/2009 2205   CMP     Component Value Date/Time   NA 142 06/20/2009 2205   K 4.6 06/20/2009 2205   CL 110 06/20/2009 2205   CO2 25 06/20/2009 2205   GLUCOSE 113* 06/20/2009 2205   BUN 13 06/20/2009 2205   CREATININE 0.73 06/20/2009 2205   CALCIUM 9.1 06/20/2009 2205   PROT 7.7 09/15/2007   ALBUMIN 4.7 09/15/2007   AST 14 09/15/2007   ALT 11 09/15/2007   ALKPHOS 81 09/15/2007   GFRNONAA >60 06/20/2009 2205   GFRAA  Value: >60        The eGFR has been calculated using  the MDRD equation. This calculation has not been validated in all clinical situations. eGFR's persistently <60 mL/min signify possible Chronic Kidney Disease. 06/20/2009 2205       Component Value Date/Time   WBC 7.5 12/22/2010 1404   WBC 7.3 12/16/2009 2255   WBC 7.3 12/16/2009   HGB 14.4 12/22/2010 1404   HGB 14.8 12/16/2009 2255   HGB 14.8 12/16/2009   HCT 42.6 12/22/2010 1404   HCT 42.4 12/16/2009 2255   HCT 42.4 12/16/2009   MCV 96.8 12/22/2010 1404   MCV 95.5 12/16/2009 2255   MCV 95.5 12/16/2009    Lipid Panel  No results found for this basename: chol, trig, hdl, cholhdl, vldl, ldlcalc    ABG No results found for this basename: phart, pco2, pco2art, po2, po2art, hco3, tco2, acidbasedef, o2sat     No results found for this basename: TSH   BNP (last 3 results) No results found for this basename: PROBNP,  in the last 8760 hours Cardiac Panel (last 3 results) No results found for this basename: CKTOTAL, CKMB, TROPONINI, RELINDX,  in the last 72 hours  Iron/TIBC/Ferritin/ %Sat No results found for this basename: iron,  tibc, ferritin, ironpctsat     EKG Orders placed in visit on 02/02/14  . EKG 12-LEAD     Prior Assessment and Plan Problem List as of 02/02/2014     Cardiovascular and Mediastinum   PAROXYSMAL ATRIAL FIBRILLATION   Last Assessment & Plan   02/02/2013 Office Visit Written 02/02/2013  1:57 PM by Lendon Colonel, NP     Remains in NSR with prolonged QT interval. 480 ms noted. She is to continued on anticoagulation therapy with Dr. Doyle Askew in [place]. She will follow up with Dr. Stanford Breed in [place] office in one year unless she is symptomatic. Refills are provided on flecainide and verapamil.    CHF   Last Assessment & Plan   02/02/2013 Office Visit Written 02/02/2013  1:59 PM by Lendon Colonel, NP     No evidence of fluid overload. She will continue current medications as directed. F/u echo can be completed on visit with Dr. Stanford Breed at his discretion.    CVA   Last Assessment & Plan   12/22/2010 Office Visit Written 12/22/2010  2:28 PM by Yehuda Savannah, MD     No recurrence of neurologic symptoms since anticoagulation initiated.      Other   CAROTID BRUITS, BILATERAL   Last Assessment & Plan   12/31/2011 Office Visit Written 12/31/2011 12:40 PM by Yehuda Savannah, MD     Carotid bruits not appreciated on examination today.  Carotid ultrasound revealed minimal disease in 2011.  In the absence of more prominent physical findings or symptoms, serial imaging is probably not necessary.    Anxiety   Chronic anticoagulation   Last Assessment & Plan   12/31/2011 Office Visit Written 12/31/2011 12:39 PM by Yehuda Savannah, MD     No evidence for occult GI blood loss, but she was advised to discuss an initial screening colonoscopy with her PCP.  Recent basic laboratory tests will be obtained from his office and reviewed.  Patient has moved within [place] and is now more than one hour from this office.  She will followup next year with Dr. Stanford Breed in our office that is only  minutes from her home.        Imaging: No results found.

## 2014-02-02 NOTE — Assessment & Plan Note (Signed)
She will need ongoing evaluation of this when she moves to [place], with carotid Doppler studies recommended every 2 years.

## 2014-02-02 NOTE — Patient Instructions (Addendum)
Enjoy your retirement in the "[place]" !!    Please get lab work today   I e-scribed [facility] refills [facility] [address] in [place]      Thank you for choosing [facility] !

## 2014-02-02 NOTE — Assessment & Plan Note (Signed)
Heart rate is currently very well controlled. She has not had to take any "pill in the pocket" extra doses of flecainide for rapid palpitations. She is being continued on current medication regimen. EKG has been reviewed with QT interval prolongation, not pathologic.  I will have her labs completed prior to her leaving, with copies to be sent to her in [place]. Address is [address] [place], [place] [address] . This will include lipids LFTs, CBC, and BMET. This also included PT with INR.  Three months of medications are provided for [facility] to [facility] [address] [place]. This will allow her time to be est. with cardiology and have plenty of medications.Marland Kitchen

## 2014-02-03 LAB — COMPREHENSIVE METABOLIC PANEL
ALBUMIN: 4.4 g/dL (ref 3.5–5.2)
ALT: 18 U/L (ref 0–35)
AST: 17 U/L (ref 0–37)
Alkaline Phosphatase: 115 U/L (ref 39–117)
BUN: 15 mg/dL (ref 6–23)
CALCIUM: 8.9 mg/dL (ref 8.4–10.5)
CO2: 25 meq/L (ref 19–32)
CREATININE: 0.82 mg/dL (ref 0.50–1.10)
Chloride: 105 mEq/L (ref 96–112)
Glucose, Bld: 79 mg/dL (ref 70–99)
Potassium: 4.3 mEq/L (ref 3.5–5.3)
Sodium: 139 mEq/L (ref 135–145)
TOTAL PROTEIN: 7 g/dL (ref 6.0–8.3)
Total Bilirubin: 0.5 mg/dL (ref 0.2–1.2)

## 2014-02-03 LAB — CBC
HEMATOCRIT: 42.6 % (ref 36.0–46.0)
Hemoglobin: 14.5 g/dL (ref 12.0–15.0)
MCH: 33 pg (ref 26.0–34.0)
MCHC: 34 g/dL (ref 30.0–36.0)
MCV: 96.8 fL (ref 78.0–100.0)
Platelets: 225 10*3/uL (ref 150–400)
RBC: 4.4 MIL/uL (ref 3.87–5.11)
RDW: 14.2 % (ref 11.5–15.5)
WBC: 7.6 10*3/uL (ref 4.0–10.5)

## 2014-02-03 LAB — LDL CHOLESTEROL, DIRECT: LDL DIRECT: 178 mg/dL — AB

## 2014-02-05 ENCOUNTER — Telehealth: Payer: Self-pay | Admitting: *Deleted

## 2014-02-05 NOTE — Telephone Encounter (Signed)
Message copied by Vernon Prey on Mon Feb 05, 2014 10:26 AM ------      Message from: Jodelle Gross      Created: Sun Feb 04, 2014 10:37 AM       Labs have been reviewed. Elevated direct LDL. She will need to watch her diet and adhere to low cholesterol food choices. Please print this and send labs to her at her new address in [place]. [address] [place], [place] [address] ------

## 2014-02-05 NOTE — Telephone Encounter (Signed)
Mailed copy to pt.

## 2014-02-21 ENCOUNTER — Other Ambulatory Visit: Payer: Self-pay | Admitting: Adult Health
